# Patient Record
Sex: Male | Born: 1960 | Race: White | Hispanic: No | Marital: Married | Smoking: Former smoker
Health system: Southern US, Community
[De-identification: ages and names within clinical notes are randomized; demographics above are authoritative.]

## PROBLEM LIST (undated history)

## (undated) DIAGNOSIS — K219 Gastro-esophageal reflux disease without esophagitis: Secondary | ICD-10-CM

## (undated) DIAGNOSIS — E785 Hyperlipidemia, unspecified: Secondary | ICD-10-CM

## (undated) DIAGNOSIS — M1990 Unspecified osteoarthritis, unspecified site: Secondary | ICD-10-CM

## (undated) DIAGNOSIS — I10 Essential (primary) hypertension: Secondary | ICD-10-CM

## (undated) HISTORY — DX: Hyperlipidemia, unspecified: E78.5

## (undated) HISTORY — PX: SHOULDER SURGERY: SHX246

## (undated) HISTORY — DX: Gastro-esophageal reflux disease without esophagitis: K21.9

## (undated) HISTORY — DX: Unspecified osteoarthritis, unspecified site: M19.90

## (undated) HISTORY — DX: Essential (primary) hypertension: I10

## (undated) HISTORY — PX: COLONOSCOPY: SHX174

---

## 1993-08-12 HISTORY — PX: INGUINAL HERNIA REPAIR: SUR1180

## 2013-08-12 HISTORY — PX: ANKLE SURGERY: SHX546

## 2013-10-07 ENCOUNTER — Encounter (HOSPITAL_BASED_OUTPATIENT_CLINIC_OR_DEPARTMENT_OTHER): Admission: RE | Payer: Self-pay | Source: Ambulatory Visit

## 2013-10-07 ENCOUNTER — Ambulatory Visit (HOSPITAL_BASED_OUTPATIENT_CLINIC_OR_DEPARTMENT_OTHER): Admission: RE | Admit: 2013-10-07 | Payer: Self-pay | Source: Ambulatory Visit | Admitting: Orthopedic Surgery

## 2013-10-07 SURGERY — RELEASE, A1 PULLEY, FOR TRIGGER FINGER
Anesthesia: Monitor Anesthesia Care | Laterality: Bilateral

## 2017-10-02 DIAGNOSIS — Z0000 Encounter for general adult medical examination without abnormal findings: Secondary | ICD-10-CM | POA: Diagnosis not present

## 2017-10-02 DIAGNOSIS — I10 Essential (primary) hypertension: Secondary | ICD-10-CM | POA: Diagnosis not present

## 2017-10-02 DIAGNOSIS — R05 Cough: Secondary | ICD-10-CM | POA: Diagnosis not present

## 2017-10-02 DIAGNOSIS — N529 Male erectile dysfunction, unspecified: Secondary | ICD-10-CM | POA: Diagnosis not present

## 2018-09-23 ENCOUNTER — Encounter: Payer: Self-pay | Admitting: Family Medicine

## 2018-09-23 ENCOUNTER — Ambulatory Visit (INDEPENDENT_AMBULATORY_CARE_PROVIDER_SITE_OTHER): Payer: BLUE CROSS/BLUE SHIELD | Admitting: Family Medicine

## 2018-09-23 ENCOUNTER — Telehealth: Payer: Self-pay | Admitting: Family Medicine

## 2018-09-23 VITALS — BP 126/80 | HR 84 | Ht 71.0 in | Wt 226.0 lb

## 2018-09-23 DIAGNOSIS — N5201 Erectile dysfunction due to arterial insufficiency: Secondary | ICD-10-CM

## 2018-09-23 DIAGNOSIS — I10 Essential (primary) hypertension: Secondary | ICD-10-CM

## 2018-09-23 LAB — BASIC METABOLIC PANEL
BUN: 15 mg/dL (ref 6–23)
CHLORIDE: 100 meq/L (ref 96–112)
CO2: 27 mEq/L (ref 19–32)
Calcium: 9.4 mg/dL (ref 8.4–10.5)
Creatinine, Ser: 0.76 mg/dL (ref 0.40–1.50)
GFR: 105.22 mL/min (ref >60.00)
Glucose, Bld: 97 mg/dL (ref 70–99)
Potassium: 4.2 mEq/L (ref 3.5–5.1)
SODIUM: 138 meq/L (ref 135–145)

## 2018-09-23 MED ORDER — SILDENAFIL CITRATE 20 MG PO TABS: ORAL_TABLET | ORAL | 1 refills | Status: DC

## 2018-09-23 MED ORDER — LISINOPRIL 20 MG PO TABS: 20.0000 mg | ORAL_TABLET | Freq: Every day | ORAL | 0 refills | Status: DC

## 2018-09-23 NOTE — Telephone Encounter (Signed)
Copied from [place] #[id number]. Topic: Quick Communication - See Telephone Encounter >> Sep 23, 2018  3:35 PM Bea Graff, NT wrote: CRM for notification. See Telephone encounter for: 09/23/18. Pt states that [facility] stated that the sildenafil (REVATIO) 20 MG tablet needs to be sent to a specialty pharmacy. The pharmacy is [facility]) Phone #: [phone] per pt. Please advise.

## 2018-09-23 NOTE — Progress Notes (Addendum)
Established Patient Office Visit  Subjective:  Patient ID: Christopher Blackburn, male    DOB: Dec 15, 1960  Age: 58 y.o. MRN: [id number]  CC:  Chief Complaint  Patient presents with  . Establish Care    needs refill on sildenafil  . Annual Exam    immunizations utd, not fasting    HPI [name] presents for establishment of care and follow-up of his hypertension and ED.  Patient has been on metoprolol 100 mg twice daily for years.  This medication was used to treat his hypertension.  He does not have a history of MI or A. fib or any other heart arrhythmia.  He did have a tendency for tachycardia when it was started for him and that is no longer an issue.  He believes that his tachycardia was due to to increased alcohol abuse at that time.  He assures me that he only drinks socially and no more than 2 drinks at a given setting.  He quit smoking over 25 years ago.  He lives with his high school sweetheart.  They were married after high school and then separated.  They have been there for a year now and plan on getting married.  He has 2 children who are grown and 6 grandchildren.  His father is 49 years old and in good health.  His mother is 29 and doing well she is dealing with CLL.  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  He has a history of ED.  He does wonder if the metoprolol is partially responsible.  At this point in time it is taking 200 mg of Viagra for that to work for him.  He does have nocturia 1 at night.  Urine flow is good.  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  He is still in rehabilitation after shoulder injury and surgery this past October.  History reviewed. No pertinent past medical history.  History reviewed. No pertinent surgical history.  History reviewed. No pertinent family history.  Social History   Socioeconomic History  .  Marital status: Significant Other    Spouse name: Not on file  . Number of children: Not on file  . Years of education: Not on file  . Highest education level: Not on file  Occupational History  . Not on file  Social Needs  . Financial resource strain: Not on file  . Food insecurity:    Worry: Not on file    Inability: Not on file  . Transportation needs:    Medical: Not on file    Non-medical: Not on file  Tobacco Use  . Smoking status: Former Research scientist (life sciences)  . Smokeless tobacco: Never Used  Substance and Sexual Activity  . Alcohol use: Not on file  . Drug use: Not on file  . Sexual activity: Not on file  Lifestyle  . Physical activity:    Days per week: Not on file    Minutes per session: Not on file  . Stress: Not on file  Relationships  . Social connections:    Talks on phone: Not on file    Gets together: Not on file    Attends religious service: Not on file    Active member of club or organization: Not on file    Attends meetings of clubs or organizations: Not on file  Relationship status: Not on file  . Intimate partner violence:    Fear of current or ex partner: Not on file    Emotionally abused: Not on file    Physically abused: Not on file    Forced sexual activity: Not on file  Other Topics Concern  . Not on file  Social History Narrative  . Not on file    Outpatient Medications Prior to Visit  Medication Sig Dispense Refill  . metoprolol tartrate (LOPRESSOR) 100 MG tablet Take 100 mg by mouth 2 (two) times daily.    . sildenafil (VIAGRA) 100 MG tablet Take 100 mg by mouth daily as needed for erectile dysfunction.     No facility-administered medications prior to visit.     No Known Allergies  ROS Review of Systems  Constitutional: Negative.   HENT: Negative.   Eyes: Negative for photophobia and visual disturbance.  Respiratory: Negative.   Cardiovascular: Negative.  Negative for chest pain and palpitations.  Gastrointestinal: Negative.     Endocrine: Negative for polyphagia and polyuria.  Genitourinary: Negative for difficulty urinating, frequency and urgency.  Musculoskeletal: Negative for arthralgias.  Skin: Negative for pallor and rash.  Allergic/Immunologic: Negative for immunocompromised state.  Neurological: Negative for light-headedness and headaches.  Hematological: Does not bruise/bleed easily.  Psychiatric/Behavioral: Negative.       Objective:    Physical Exam  Constitutional: He is oriented to person, place, and time. He appears well-developed and well-nourished. No distress.  HENT:  Head: Normocephalic and atraumatic.  Right Ear: External ear normal.  Left Ear: External ear normal.  Mouth/Throat: No oropharyngeal exudate.  Eyes: Pupils are equal, round, and reactive to light. Conjunctivae are normal. Right eye exhibits no discharge. Left eye exhibits no discharge. No scleral icterus.  Neck: Neck supple. No JVD present. No tracheal deviation present. No thyromegaly present.  Cardiovascular: Normal rate, regular rhythm and normal heart sounds.  Pulmonary/Chest: Effort normal and breath sounds normal. No stridor.  Abdominal: Bowel sounds are normal.  Lymphadenopathy:    He has no cervical adenopathy.  Neurological: He is alert and oriented to person, place, and time.  Skin: Skin is warm and dry. He is not diaphoretic.  Psychiatric: He has a normal mood and affect. His behavior is normal.    BP 126/80   Pulse 84   Ht 5' 11"  (1.803 m)   Wt 226 lb (102.5 kg)   SpO2 97%   BMI 31.52 kg/m  Wt Readings from Last 3 Encounters:  09/23/18 226 lb (102.5 kg)   BP Readings from Last 3 Encounters:  09/23/18 126/80   Guideline developer:  UpToDate (see UpToDate for funding source) Date Released: June 2014  There are no preventive care reminders to display for this patient.  There are no preventive care reminders to display for this patient.  No results found for: TSH No results found for: WBC, HGB,  HCT, MCV, PLT No results found for: NA, K, CHLORIDE, CO2, GLUCOSE, BUN, CREATININE, BILITOT, ALKPHOS, AST, ALT, PROT, ALBUMIN, CALCIUM, ANIONGAP, EGFR, GFR No results found for: CHOL No results found for: HDL No results found for: LDLCALC No results found for: TRIG No results found for: CHOLHDL No results found for: HGBA1C    Assessment & Plan:   Problem List Items Addressed This Visit      Cardiovascular and Mediastinum   Essential hypertension - Primary   Relevant Medications   sildenafil (VIAGRA) 100 MG tablet   metoprolol tartrate (LOPRESSOR) 100 MG tablet   sildenafil (  REVATIO) 20 MG tablet   lisinopril (PRINIVIL,ZESTRIL) 20 MG tablet   Other Relevant Orders   Basic metabolic panel   Erectile dysfunction due to arterial insufficiency   Relevant Medications   sildenafil (VIAGRA) 100 MG tablet   metoprolol tartrate (LOPRESSOR) 100 MG tablet   sildenafil (REVATIO) 20 MG tablet   lisinopril (PRINIVIL,ZESTRIL) 20 MG tablet      Meds ordered this encounter  Medications  . sildenafil (REVATIO) 20 MG tablet    Sig: May take 3-5 daily 45 minutes prior    Dispense:  50 tablet    Refill:  1  . lisinopril (PRINIVIL,ZESTRIL) 20 MG tablet    Sig: Take 1 tablet (20 mg total) by mouth daily.    Dispense:  90 tablet    Refill:  0    Follow-up: Return in about 2 weeks (around 10/07/2018), or Will start metoprolol taper and begin lisinopril.   Patient will start the metoprolol taper 50 mg twice daily for 5 days, then 25 mg twice daily for 5 days then 12.5 mg twice daily for 5 days and stop once starting the taper he will begin his lisinopril 20 mg daily.  He will follow-up in 2 weeks for his scheduled complete physical.

## 2018-09-24 MED ORDER — SILDENAFIL CITRATE 20 MG PO TABS: ORAL_TABLET | ORAL | 1 refills | Status: DC

## 2018-09-24 NOTE — Addendum Note (Signed)
Addended by: Rodrigo Ran on: 09/24/2018 08:06 AM   Modules accepted: Orders

## 2018-09-24 NOTE — Telephone Encounter (Signed)
Rx sent to [facility]

## 2018-10-06 ENCOUNTER — Encounter: Payer: Self-pay | Admitting: Family Medicine

## 2018-10-06 ENCOUNTER — Ambulatory Visit (INDEPENDENT_AMBULATORY_CARE_PROVIDER_SITE_OTHER): Payer: BLUE CROSS/BLUE SHIELD | Admitting: Family Medicine

## 2018-10-06 VITALS — BP 128/80 | HR 82 | Temp 98.2°F | Ht 71.0 in | Wt 226.0 lb

## 2018-10-06 DIAGNOSIS — Z0000 Encounter for general adult medical examination without abnormal findings: Secondary | ICD-10-CM | POA: Diagnosis not present

## 2018-10-06 NOTE — Patient Instructions (Signed)
Health Maintenance, Male A healthy lifestyle and preventive care is important for your health and wellness. Ask your health care provider about what schedule of regular examinations is right for you. What should I know about weight and diet? Eat a Healthy Diet  Eat plenty of vegetables, fruits, whole grains, low-fat dairy products, and lean protein.  Do not eat a lot of foods high in solid fats, added sugars, or salt.  Maintain a Healthy Weight Regular exercise can help you achieve or maintain a healthy weight. You should:  Do at least 150 minutes of exercise each week. The exercise should increase your heart rate and make you sweat (moderate-intensity exercise).  Do strength-training exercises at least twice a week. Watch Your Levels of Cholesterol and Blood Lipids  Have your blood tested for lipids and cholesterol every 5 years starting at 59 years of age. If you are at high risk for heart disease, you should start having your blood tested when you are 59 years old. You may need to have your cholesterol levels checked more often if: ? Your lipid or cholesterol levels are high. ? You are older than 59 years of age. ? You are at high risk for heart disease. What should I know about cancer screening? Many types of cancers can be detected early and may often be prevented. Lung Cancer  You should be screened every year for lung cancer if: ? You are a current smoker who has smoked for at least 30 years. ? You are a former smoker who has quit within the past 15 years.  Talk to your health care provider about your screening options, when you should start screening, and how often you should be screened. Colorectal Cancer  Routine colorectal cancer screening usually begins at 59 years of age and should be repeated every 5-10 years until you are 59 years old. You may need to be screened more often if early forms of precancerous polyps or small growths are found. Your health care provider may  recommend screening at an earlier age if you have risk factors for colon cancer.  Your health care provider may recommend using home test kits to check for hidden blood in the stool.  A small camera at the end of a tube can be used to examine your colon (sigmoidoscopy or colonoscopy). This checks for the earliest forms of colorectal cancer. Prostate and Testicular Cancer  Depending on your age and overall health, your health care provider may do certain tests to screen for prostate and testicular cancer.  Talk to your health care provider about any symptoms or concerns you have about testicular or prostate cancer. Skin Cancer  Check your skin from head to toe regularly.  Tell your health care provider about any new moles or changes in moles, especially if: ? There is a change in a mole's size, shape, or color. ? You have a mole that is larger than a pencil eraser.  Always use sunscreen. Apply sunscreen liberally and repeat throughout the day.  Protect yourself by wearing long sleeves, pants, a wide-brimmed hat, and sunglasses when outside. What should I know about heart disease, diabetes, and high blood pressure?  If you are 18-39 years of age, have your blood pressure checked every 3-5 years. If you are 40 years of age or older, have your blood pressure checked every year. You should have your blood pressure measured twice-once when you are at a hospital or clinic, and once when you are not at a hospital   or clinic. Record the average of the two measurements. To check your blood pressure when you are not at a hospital or clinic, you can use: ? An automated blood pressure machine at a pharmacy. ? A home blood pressure monitor.  Talk to your health care provider about your target blood pressure.  If you are between 34-41 years old, ask your health care provider if you should take aspirin to prevent heart disease.  Have regular diabetes screenings by checking your fasting blood sugar  level. ? If you are at a normal weight and have a low risk for diabetes, have this test once every three years after the age of 39. ? If you are overweight and have a high risk for diabetes, consider being tested at a younger age or more often.  A one-time screening for abdominal aortic aneurysm (AAA) by ultrasound is recommended for men aged 66-75 years who are current or former smokers. What should I know about preventing infection? Hepatitis B If you have a higher risk for hepatitis B, you should be screened for this virus. Talk with your health care provider to find out if you are at risk for hepatitis B infection. Hepatitis C Blood testing is recommended for:  Everyone born from [birth year] through 1966.  Anyone with known risk factors for hepatitis C. Sexually Transmitted Diseases (STDs)  You should be screened each year for STDs including gonorrhea and chlamydia if: ? You are sexually active and are younger than 59 years of age. ? You are older than 59 years of age and your health care provider tells you that you are at risk for this type of infection. ? Your sexual activity has changed since you were last screened and you are at an increased risk for chlamydia or gonorrhea. Ask your health care provider if you are at risk.  Talk with your health care provider about whether you are at high risk of being infected with HIV. Your health care provider may recommend a prescription medicine to help prevent HIV infection. What else can I do?  Schedule regular health, dental, and eye exams.  Stay current with your vaccines (immunizations).  Do not use any tobacco products, such as cigarettes, chewing tobacco, and e-cigarettes. If you need help quitting, ask your health care provider.  Limit alcohol intake to no more than 2 drinks per day. One drink equals 12 ounces of beer, 5 ounces of wine, or 1 ounces of hard liquor.  Do not use street drugs.  Do not share needles.  Ask your health  care provider for help if you need support or information about quitting drugs.  Tell your health care provider if you often feel depressed.  Tell your health care provider if you have ever been abused or do not feel safe at home. This information is not intended to replace advice given to you by your health care provider. Make sure you discuss any questions you have with your health care provider. Document Released: 01/25/2008 Document Revised: 03/27/2016 Document Reviewed: 05/02/2015 Elsevier Interactive Patient Education  2019 [place] refers to food and lifestyle choices that are based on the traditions of countries located on the [employer]. This way of eating has been shown to help prevent certain conditions and improve outcomes for people who have chronic diseases, like kidney disease and heart disease. What are tips for following this plan? Lifestyle  Cook and eat meals together with your family, when possible.  Drink  enough fluid to keep your urine clear or pale yellow.  Be physically active every day. This includes: ? Aerobic exercise like running or swimming. ? Leisure activities like gardening, walking, or housework.  Get 7-8 hours of sleep each night.  If recommended by your health care provider, drink red wine in moderation. This means 1 glass a day for nonpregnant women and 2 glasses a day for men. A glass of wine equals 5 oz (150 mL). Reading food labels   Check the serving size of packaged foods. For foods such as rice and pasta, the serving size refers to the amount of cooked product, not dry.  Check the total fat in packaged foods. Avoid foods that have saturated fat or trans fats.  Check the ingredients list for added sugars, such as corn syrup. Shopping  At the grocery store, buy most of your food from the areas near the walls of the store. This includes: ? Fresh fruits and vegetables (produce). ? Grains,  beans, nuts, and seeds. Some of these may be available in unpackaged forms or large amounts (in bulk). ? Fresh seafood. ? Poultry and eggs. ? Low-fat dairy products.  Buy whole ingredients instead of prepackaged foods.  Buy fresh fruits and vegetables in-season from local farmers markets.  Buy frozen fruits and vegetables in resealable bags.  If you do not have access to quality fresh seafood, buy precooked frozen shrimp or canned fish, such as tuna, salmon, or sardines.  Buy small amounts of raw or cooked vegetables, salads, or olives from the deli or salad bar at your store.  Stock your pantry so you always have certain foods on hand, such as olive oil, canned tuna, canned tomatoes, rice, pasta, and beans. Cooking  Cook foods with extra-virgin olive oil instead of using butter or other vegetable oils.  Have meat as a side dish, and have vegetables or grains as your main dish. This means having meat in small portions or adding small amounts of meat to foods like pasta or stew.  Use beans or vegetables instead of meat in common dishes like chili or lasagna.  Experiment with different cooking methods. Try roasting or broiling vegetables instead of steaming or sauteing them.  Add frozen vegetables to soups, stews, pasta, or rice.  Add nuts or seeds for added healthy fat at each meal. You can add these to yogurt, salads, or vegetable dishes.  Marinate fish or vegetables using olive oil, lemon juice, garlic, and fresh herbs. Meal planning   Plan to eat 1 vegetarian meal one day each week. Try to work up to 2 vegetarian meals, if possible.  Eat seafood 2 or more times a week.  Have healthy snacks readily available, such as: ? Vegetable sticks with hummus. ? [place] yogurt. ? Fruit and nut trail mix.  Eat balanced meals throughout the week. This includes: ? Fruit: 2-3 servings a day ? Vegetables: 4-5 servings a day ? Low-fat dairy: 2 servings a day ? Fish, poultry, or lean  meat: 1 serving a day ? Beans and legumes: 2 or more servings a week ? Nuts and seeds: 1-2 servings a day ? Whole grains: 6-8 servings a day ? Extra-virgin olive oil: 3-4 servings a day  Limit red meat and sweets to only a few servings a month What are my food choices?  Mediterranean diet ? Recommended ? Grains: Whole-grain pasta. Brown rice. Bulgar wheat. Polenta. Couscous. Whole-wheat bread. [place]. ? Vegetables: Artichokes. Beets. Broccoli. Cabbage. Carrots. Eggplant. Green beans. Chard.  Kale. Spinach. Onions. Leeks. Peas. Squash. Tomatoes. Peppers. Radishes. ? Fruits: Apples. Apricots. Avocado. Berries. Bananas. Cherries. Dates. Figs. Grapes. Lemons. Melon. Oranges. Peaches. Plums. Pomegranate. ? Meats and other protein foods: Beans. Almonds. Sunflower seeds. Pine nuts. Peanuts. [place]. Salmon. Scallops. Shrimp. [place]. Tilapia. Clams. Oysters. Eggs. ? Dairy: Low-fat milk. Cheese. Greek yogurt. ? Beverages: Water. Red wine. Herbal tea. ? Fats and oils: Extra virgin olive oil. Avocado oil. Grape seed oil. ? Sweets and desserts: [place] yogurt with honey. Baked apples. Poached pears. Trail mix. ? Seasoning and other foods: Basil. Cilantro. Coriander. Cumin. Mint. Parsley. Sage. Rosemary. Tarragon. Garlic. Oregano. Thyme. Pepper. Balsalmic vinegar. Tahini. Hummus. Tomato sauce. Olives. Mushrooms. ? Limit these ? Grains: Prepackaged pasta or rice dishes. Prepackaged cereal with added sugar. ? Vegetables: Deep fried potatoes (french fries). ? Fruits: Fruit canned in syrup. ? Meats and other protein foods: Beef. Pork. Lamb. Poultry with skin. Hot dogs. [place] Salines. ? Dairy: Ice cream. Sour cream. Whole milk. ? Beverages: Juice. Sugar-sweetened soft drinks. Beer. Liquor and spirits. ? Fats and oils: Butter. Canola oil. Vegetable oil. Beef fat (tallow). Lard. ? Sweets and desserts: Cookies. Cakes. Pies. Candy. ? Seasoning and other foods: Mayonnaise. Premade sauces and marinades. ? The items  listed may not be a complete list. Talk with your dietitian about what dietary choices are right for you. Summary  The Mediterranean diet includes both food and lifestyle choices.  Eat a variety of fresh fruits and vegetables, beans, nuts, seeds, and whole grains.  Limit the amount of red meat and sweets that you eat.  Talk with your health care provider about whether it is safe for you to drink red wine in moderation. This means 1 glass a day for nonpregnant women and 2 glasses a day for men. A glass of wine equals 5 oz (150 mL). This information is not intended to replace advice given to you by your health care provider. Make sure you discuss any questions you have with your health care provider. Document Released: 03/21/2016 Document Revised: 04/23/2016 Document Reviewed: 03/21/2016 Elsevier Interactive Patient Education  2019 [employer].

## 2018-10-06 NOTE — Progress Notes (Signed)
Established Patient Office Visit  Subjective:  Patient ID: Christopher Blackburn, male    DOB: 26-Sep-1960  Age: 58 y.o. MRN: [id number]  CC:  Chief Complaint  Patient presents with  . Annual Exam    HPI Nohlan Burdin presents for for complete physical exam and follow-up of his blood pressure status post metoprolol wean and initiation of lisinopril therapy.  Patient is tolerating the lisinopril well and is not having any type of cough.  Blood pressure seen today is on lisinopril treatment only.  Taper of metoprolol was uneventful for him.  He is nonfasting today.  I did have a colonoscopy in the distant past that showed some polyps.  He is interested in pursuing the Cologuard at this time.  He is currently using a weight watchers like diet plan with his significant other.  He is hoping to lose some weight.  Past medical history of right hernia repair.  He sometimes feels as though it is return but it has not bothered him.  Past Medical History:  Diagnosis Date  . GERD (gastroesophageal reflux disease)   . Hypertension     Past Surgical History:  Procedure Laterality Date  . SHOULDER SURGERY      History reviewed. No pertinent family history.  Social History   Socioeconomic History  . Marital status: Significant Other    Spouse name: Not on file  . Number of children: Not on file  . Years of education: Not on file  . Highest education level: Not on file  Occupational History  . Not on file  Social Needs  . Financial resource strain: Not on file  . Food insecurity:    Worry: Not on file    Inability: Not on file  . Transportation needs:    Medical: Not on file    Non-medical: Not on file  Tobacco Use  . Smoking status: Former Smoker    Last attempt to quit: 09/23/1992    Years since quitting: 26.0  . Smokeless tobacco: Never Used  Substance and Sexual Activity  . Alcohol use: Yes    Comment: socially and no more than 2 in a given setting.   . Drug use: Never  .  Sexual activity: Not on file  Lifestyle  . Physical activity:    Days per week: Not on file    Minutes per session: Not on file  . Stress: Not on file  Relationships  . Social connections:    Talks on phone: Not on file    Gets together: Not on file    Attends religious service: Not on file    Active member of club or organization: Not on file    Attends meetings of clubs or organizations: Not on file    Relationship status: Not on file  . Intimate partner violence:    Fear of current or ex partner: Not on file    Emotionally abused: Not on file    Physically abused: Not on file    Forced sexual activity: Not on file  Other Topics Concern  . Not on file  Social History Narrative  . Not on file    Outpatient Medications Prior to Visit  Medication Sig Dispense Refill  . lisinopril (PRINIVIL,ZESTRIL) 20 MG tablet Take 1 tablet (20 mg total) by mouth daily. 90 tablet 0  . sildenafil (REVATIO) 20 MG tablet May take 3-5 daily 45 minutes prior 50 tablet 1  . metoprolol tartrate (LOPRESSOR) 100 MG tablet Take 100 mg by mouth  2 (two) times daily.    . sildenafil (VIAGRA) 100 MG tablet Take 100 mg by mouth daily as needed for erectile dysfunction.     No facility-administered medications prior to visit.     No Known Allergies  ROS Review of Systems  Constitutional: Negative for diaphoresis, fatigue, fever and unexpected weight change.  HENT: Negative.   Eyes: Negative for photophobia and visual disturbance.  Respiratory: Negative for cough, shortness of breath and wheezing.   Cardiovascular: Negative for palpitations.  Gastrointestinal: Negative for abdominal pain, blood in stool, constipation and diarrhea.  Endocrine: Negative for polyphagia and polyuria.  Genitourinary: Negative for difficulty urinating, frequency, hematuria and urgency.  Musculoskeletal: Negative for gait problem and joint swelling.  Skin: Negative for pallor and rash.  Allergic/Immunologic: Negative for  immunocompromised state.  Neurological: Negative for light-headedness and headaches.  Hematological: Does not bruise/bleed easily.  Psychiatric/Behavioral: Negative.       Objective:    Physical Exam  Constitutional: He is oriented to person, place, and time. He appears well-developed and well-nourished. No distress.  HENT:  Head: Normocephalic and atraumatic.  Right Ear: External ear normal.  Left Ear: External ear normal.  Mouth/Throat: Oropharynx is clear and moist. No oropharyngeal exudate.  Eyes: Pupils are equal, round, and reactive to light. Conjunctivae are normal. Right eye exhibits no discharge. Left eye exhibits no discharge. No scleral icterus.  Neck: Neck supple. No JVD present. No tracheal deviation present. No thyromegaly present.  Cardiovascular: Normal rate, regular rhythm and normal heart sounds.  Pulmonary/Chest: Effort normal and breath sounds normal. No stridor.  Abdominal: Soft. Bowel sounds are normal. He exhibits no distension and no mass. There is no abdominal tenderness. There is no rebound and no guarding. A hernia is present. Hernia confirmed positive in the right inguinal area (small right). Hernia confirmed negative in the left inguinal area.  Genitourinary:    Prostate, penis and rectum normal.  Rectum:     Guaiac result negative.     No rectal mass, anal fissure, tenderness, external hemorrhoid, internal hemorrhoid or abnormal anal tone.  Prostate is not enlarged and not tender. Right testis shows no mass and no swelling. Right testis is descended. Left testis shows no mass, no swelling and no tenderness. Left testis is descended. Uncircumcised. No phimosis, paraphimosis, hypospadias, penile erythema or penile tenderness. No discharge found.  Musculoskeletal:        General: No tenderness, deformity or edema.  Lymphadenopathy:    He has no cervical adenopathy.       Right: No inguinal adenopathy present.       Left: No inguinal adenopathy present.    Neurological: He is alert and oriented to person, place, and time. No cranial nerve deficit. Coordination normal.  Skin: Skin is warm and dry. No rash noted. No erythema.     Psychiatric: He has a normal mood and affect. His behavior is normal.    BP 128/80   Pulse 82   Temp 98.2 F (36.8 C) (Oral)   Ht 5\' 11"  (1.803 m)   Wt 226 lb (102.5 kg)   SpO2 96%   BMI 31.52 kg/m  Wt Readings from Last 3 Encounters:  10/06/18 226 lb (102.5 kg)  09/23/18 226 lb (102.5 kg)   BP Readings from Last 3 Encounters:  10/06/18 128/80  09/23/18 126/80   Guideline developer:  UpToDate (see UpToDate for funding source) Date Released: June 2014  There are no preventive care reminders to display for this patient.  There  are no preventive care reminders to display for this patient.  No results found for: TSH No results found for: WBC, HGB, HCT, MCV, PLT Lab Results  Component Value Date   NA 138 09/23/2018   K 4.2 09/23/2018   CO2 27 09/23/2018   GLUCOSE 97 09/23/2018   BUN 15 09/23/2018   CREATININE 0.76 09/23/2018   CALCIUM 9.4 09/23/2018   GFR 105.22 09/23/2018   No results found for: CHOL No results found for: HDL No results found for: LDLCALC No results found for: TRIG No results found for: CHOLHDL No results found for: HGBA1C    Assessment & Plan:   Problem List Items Addressed This Visit      Other   Healthcare maintenance - Primary   Relevant Orders   CBC   Comprehensive metabolic panel   Lipid panel   PSA   Urinalysis, Routine w reflex microscopic      No orders of the defined types were placed in this encounter.   Follow-up: Return in about 3 months (around 01/04/2019).   Patient will return fasting for above ordered blood work.  He will continue lisinopril and follow-up in 3 months.  He was given information on health maintenance and disease prevention.  He was also given information on the Mediterranean diet.  Watchful waiting for his small right inguinal  hernia.  Advised to avoid heavy lifting pushing and pulling.  Follow-up with any change.

## 2018-10-19 ENCOUNTER — Other Ambulatory Visit (INDEPENDENT_AMBULATORY_CARE_PROVIDER_SITE_OTHER): Payer: BLUE CROSS/BLUE SHIELD

## 2018-10-19 DIAGNOSIS — Z0000 Encounter for general adult medical examination without abnormal findings: Secondary | ICD-10-CM

## 2018-10-19 LAB — COMPREHENSIVE METABOLIC PANEL
ALT: 15 U/L (ref 0–53)
AST: 17 U/L (ref 0–37)
Albumin: 4.1 g/dL (ref 3.5–5.2)
Alkaline Phosphatase: 68 U/L (ref 39–117)
BUN: 11 mg/dL (ref 6–23)
CO2: 27 mEq/L (ref 19–32)
Calcium: 9.1 mg/dL (ref 8.4–10.5)
Chloride: 103 mEq/L (ref 96–112)
Creatinine, Ser: 0.89 mg/dL (ref 0.40–1.50)
GFR: 87.67 mL/min (ref >60.00)
GLUCOSE: 99 mg/dL (ref 70–99)
Potassium: 4.4 mEq/L (ref 3.5–5.1)
Sodium: 137 mEq/L (ref 135–145)
Total Bilirubin: 0.8 mg/dL (ref 0.2–1.2)
Total Protein: 6.6 g/dL (ref 6.0–8.3)

## 2018-10-19 LAB — URINALYSIS, ROUTINE W REFLEX MICROSCOPIC
Bilirubin Urine: NEGATIVE
KETONES UR: NEGATIVE
Leukocytes,Ua: NEGATIVE
NITRITE: NEGATIVE
Specific Gravity, Urine: 1.02 (ref 1.000–1.030)
Total Protein, Urine: NEGATIVE
Urine Glucose: NEGATIVE
Urobilinogen, UA: 0.2 (ref 0.0–1.0)
pH: 6.5 (ref 5.0–8.0)

## 2018-10-19 LAB — CBC
HEMATOCRIT: 46.4 % (ref 39.0–52.0)
Hemoglobin: 16.1 g/dL (ref 13.0–17.0)
MCHC: 34.8 g/dL (ref 30.0–36.0)
MCV: 88.2 fl (ref 78.0–100.0)
Platelets: 294 10*3/uL (ref 150.0–400.0)
RBC: 5.25 Mil/uL (ref 4.22–5.81)
RDW: 14.1 % (ref 11.5–15.5)
WBC: 7.2 10*3/uL (ref 4.0–10.5)

## 2018-10-19 LAB — PSA: PSA: 0.44 ng/mL (ref 0.10–4.00)

## 2018-10-19 LAB — LIPID PANEL
Cholesterol: 186 mg/dL (ref 0–200)
HDL: 46.9 mg/dL (ref >39.00)
LDL Cholesterol: 111 mg/dL — ABNORMAL HIGH (ref 0–99)
NONHDL: 139.09
Total CHOL/HDL Ratio: 4
Triglycerides: 140 mg/dL (ref 0.0–149.0)
VLDL: 28 mg/dL (ref 0.0–40.0)

## 2018-11-25 ENCOUNTER — Other Ambulatory Visit: Payer: Self-pay | Admitting: Family Medicine

## 2018-11-25 DIAGNOSIS — N5201 Erectile dysfunction due to arterial insufficiency: Secondary | ICD-10-CM

## 2018-11-25 MED ORDER — SILDENAFIL CITRATE 20 MG PO TABS: ORAL_TABLET | ORAL | 1 refills | Status: DC

## 2018-11-25 NOTE — Telephone Encounter (Signed)
Requested Prescriptions  Pending Prescriptions Disp Refills  . sildenafil (REVATIO) 20 MG tablet 50 tablet 1    Sig: May take 3-5 daily 45 minutes prior     Urology: Erectile Dysfunction Agents Passed - 11/25/2018  1:07 PM      Passed - Last BP in normal range    BP Readings from Last 1 Encounters:  10/06/18 128/80         Passed - Valid encounter within last 12 months    Recent Outpatient Visits          1 month ago Healthcare maintenance   [facility], Mortimer Fries, MD   2 months ago Essential hypertension   [facility] Ethelene Hal, Mortimer Fries, MD

## 2018-12-15 ENCOUNTER — Other Ambulatory Visit: Payer: Self-pay | Admitting: Family Medicine

## 2018-12-15 DIAGNOSIS — I10 Essential (primary) hypertension: Secondary | ICD-10-CM

## 2019-03-16 ENCOUNTER — Other Ambulatory Visit: Payer: Self-pay | Admitting: Family Medicine

## 2019-03-16 DIAGNOSIS — I10 Essential (primary) hypertension: Secondary | ICD-10-CM

## 2019-03-19 DIAGNOSIS — H43811 Vitreous degeneration, right eye: Secondary | ICD-10-CM | POA: Diagnosis not present

## 2019-03-24 ENCOUNTER — Other Ambulatory Visit: Payer: Self-pay

## 2019-03-24 DIAGNOSIS — I10 Essential (primary) hypertension: Secondary | ICD-10-CM

## 2019-03-24 MED ORDER — LISINOPRIL 20 MG PO TABS: 20.0000 mg | ORAL_TABLET | Freq: Every day | ORAL | 2 refills | Status: DC

## 2019-04-07 ENCOUNTER — Telehealth: Payer: Self-pay | Admitting: Family Medicine

## 2019-04-07 NOTE — Telephone Encounter (Signed)
Called pt on behalf of Dr Ethelene Hal, based on last appt pt was to schedule a 3 month follow up which would have been in May, now it would be a 6 month follow up. Left message to see if pt would like to schedule.

## 2019-04-08 ENCOUNTER — Other Ambulatory Visit: Payer: Self-pay

## 2019-04-08 DIAGNOSIS — N5201 Erectile dysfunction due to arterial insufficiency: Secondary | ICD-10-CM

## 2019-04-08 MED ORDER — SILDENAFIL CITRATE 20 MG PO TABS: ORAL_TABLET | ORAL | 1 refills | Status: DC

## 2019-04-20 DIAGNOSIS — H43811 Vitreous degeneration, right eye: Secondary | ICD-10-CM | POA: Diagnosis not present

## 2019-04-22 ENCOUNTER — Encounter: Payer: Self-pay | Admitting: Family Medicine

## 2019-04-22 ENCOUNTER — Ambulatory Visit (INDEPENDENT_AMBULATORY_CARE_PROVIDER_SITE_OTHER): Payer: BC Managed Care – PPO | Admitting: Family Medicine

## 2019-04-22 ENCOUNTER — Other Ambulatory Visit: Payer: Self-pay

## 2019-04-22 VITALS — BP 128/80 | HR 80 | Ht 71.0 in | Wt 223.2 lb

## 2019-04-22 DIAGNOSIS — N5201 Erectile dysfunction due to arterial insufficiency: Secondary | ICD-10-CM | POA: Diagnosis not present

## 2019-04-22 DIAGNOSIS — I10 Essential (primary) hypertension: Secondary | ICD-10-CM | POA: Diagnosis not present

## 2019-04-22 DIAGNOSIS — G4700 Insomnia, unspecified: Secondary | ICD-10-CM

## 2019-04-22 DIAGNOSIS — Z23 Encounter for immunization: Secondary | ICD-10-CM

## 2019-04-22 LAB — URINALYSIS, ROUTINE W REFLEX MICROSCOPIC
Bilirubin Urine: NEGATIVE
Ketones, ur: NEGATIVE
Leukocytes,Ua: NEGATIVE
Nitrite: NEGATIVE
Specific Gravity, Urine: 1.01 (ref 1.000–1.030)
Total Protein, Urine: NEGATIVE
Urine Glucose: NEGATIVE
Urobilinogen, UA: 0.2 (ref 0.0–1.0)
WBC, UA: NONE SEEN (ref 0–?)
pH: 6 (ref 5.0–8.0)

## 2019-04-22 LAB — BASIC METABOLIC PANEL
BUN: 19 mg/dL (ref 6–23)
CO2: 27 mEq/L (ref 19–32)
Calcium: 8.8 mg/dL (ref 8.4–10.5)
Chloride: 103 mEq/L (ref 96–112)
Creatinine, Ser: 1.08 mg/dL (ref 0.40–1.50)
GFR: 70 mL/min (ref >60.00)
Glucose, Bld: 91 mg/dL (ref 70–99)
Potassium: 4.5 mEq/L (ref 3.5–5.1)
Sodium: 135 mEq/L (ref 135–145)

## 2019-04-22 MED ORDER — SILDENAFIL CITRATE 20 MG PO TABS: ORAL_TABLET | ORAL | 2 refills | Status: DC

## 2019-04-22 MED ORDER — LISINOPRIL 20 MG PO TABS: 20.0000 mg | ORAL_TABLET | Freq: Every day | ORAL | 2 refills | Status: DC

## 2019-04-22 NOTE — Patient Instructions (Signed)

## 2019-04-22 NOTE — Progress Notes (Addendum)
Established Patient Office Visit  Subjective:  Patient ID: Christopher Blackburn, male    DOB: 10/13/1960  Age: 58 y.o. MRN: [id number]  CC:  Chief Complaint  Patient presents with  . Medication Refill    HPI Christopher Blackburn presents for follow-up of his hypertension and ED.  Blood pressure is well controlled with lisinopril and is tolerating the drug beautifully.  Revatio works well for him and he has required dosages much lower than the directions on his prescriptions suggest that he take.  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  He stays active at home and has been walking in his neighborhood.  Past Medical History:  Diagnosis Date  . GERD (gastroesophageal reflux disease)   . Hypertension     Past Surgical History:  Procedure Laterality Date  . SHOULDER SURGERY      History reviewed. No pertinent family history.  Social History   Socioeconomic History  . Marital status: Significant Other    Spouse name: Not on file  . Number of children: Not on file  . Years of education: Not on file  . Highest education level: Not on file  Occupational History  . Not on file  Social Needs  . Financial resource strain: Not on file  . Food insecurity    Worry: Not on file    Inability: Not on file  . Transportation needs    Medical: Not on file    Non-medical: Not on file  Tobacco Use  . Smoking status: Former Smoker    Quit date: 09/23/1992    Years since quitting: 26.5  . Smokeless tobacco: Never Used  Substance and Sexual Activity  . Alcohol use: Yes    Comment: socially and no more than 2 in a given setting.   . Drug use: Never  . Sexual activity: Not on file  Lifestyle  . Physical activity    Days per week: Not on file    Minutes per session: Not on file  . Stress: Not on file  Relationships  . Social Herbalist on phone: Not on file    Gets together: Not on file    Attends religious service: Not on file   Active member of club or organization: Not on file    Attends meetings of clubs or organizations: Not on file    Relationship status: Not on file  . Intimate partner violence    Fear of current or ex partner: Not on file    Emotionally abused: Not on file    Physically abused: Not on file    Forced sexual activity: Not on file  Other Topics Concern  . Not on file  Social History Narrative  . Not on file    Outpatient Medications Prior to Visit  Medication Sig Dispense Refill  . lisinopril (ZESTRIL) 20 MG tablet Take 1 tablet (20 mg total) by mouth daily. 30 tablet 2  . sildenafil (REVATIO) 20 MG tablet May take 3-5 daily 45 minutes prior 50 tablet 1   No facility-administered medications prior to visit.     No Known Allergies  ROS Review of Systems  Constitutional: Negative.   HENT: Negative.   Eyes: Negative for photophobia and visual disturbance.  Respiratory: Negative.   Cardiovascular: Negative.   Gastrointestinal: Negative.   Endocrine: Negative for polyphagia and polyuria.  Genitourinary: Negative.   Musculoskeletal: Negative for gait problem and joint swelling.  Skin: Negative for pallor and rash.  Allergic/Immunologic: Negative for immunocompromised state.  Neurological: Negative for light-headedness and numbness.  Hematological: Does not bruise/bleed easily.  Psychiatric/Behavioral: Negative.       Objective:    Physical Exam  Constitutional: He is oriented to person, place, and time. He appears well-developed and well-nourished. No distress.  HENT:  Head: Normocephalic and atraumatic.  Right Ear: External ear normal.  Left Ear: External ear normal.  Mouth/Throat: Oropharynx is clear and moist. No oropharyngeal exudate.  Eyes: Conjunctivae are normal. Right eye exhibits no discharge. Left eye exhibits no discharge. No scleral icterus.  Neck: Neck supple. No JVD present. No tracheal deviation present. No thyromegaly present.  Cardiovascular: Normal rate,  regular rhythm and normal heart sounds.  Pulmonary/Chest: Effort normal and breath sounds normal. No stridor.  Abdominal: Bowel sounds are normal.  Musculoskeletal:        General: No edema.  Lymphadenopathy:    He has no cervical adenopathy.  Neurological: He is alert and oriented to person, place, and time.  Skin: Skin is warm and dry. He is not diaphoretic.  Psychiatric: He has a normal mood and affect. His behavior is normal.    BP 128/80   Pulse 80   Ht 5\' 11"  (1.803 m)   Wt 223 lb 4 oz (101.3 kg)   SpO2 99%   BMI 31.14 kg/m  Wt Readings from Last 3 Encounters:  04/22/19 223 lb 4 oz (101.3 kg)  10/06/18 226 lb (102.5 kg)  09/23/18 226 lb (102.5 kg)   BP Readings from Last 3 Encounters:  04/22/19 128/80  10/06/18 128/80  09/23/18 126/80   Guideline developer:  UpToDate (see UpToDate for funding source) Date Released: June 2014  There are no preventive care reminders to display for this patient.  There are no preventive care reminders to display for this patient.  No results found for: TSH Lab Results  Component Value Date   WBC 7.2 10/19/2018   HGB 16.1 10/19/2018   HCT 46.4 10/19/2018   MCV 88.2 10/19/2018   PLT 294.0 10/19/2018   Lab Results  Component Value Date   NA 135 04/22/2019   K 4.5 04/22/2019   CO2 27 04/22/2019   GLUCOSE 91 04/22/2019   BUN 19 04/22/2019   CREATININE 1.08 04/22/2019   BILITOT 0.8 10/19/2018   ALKPHOS 68 10/19/2018   AST 17 10/19/2018   ALT 15 10/19/2018   PROT 6.6 10/19/2018   ALBUMIN 4.1 10/19/2018   CALCIUM 8.8 04/22/2019   GFR 70.00 04/22/2019   Lab Results  Component Value Date   CHOL 186 10/19/2018   Lab Results  Component Value Date   HDL 46.90 10/19/2018   Lab Results  Component Value Date   LDLCALC 111 (H) 10/19/2018   Lab Results  Component Value Date   TRIG 140.0 10/19/2018   Lab Results  Component Value Date   CHOLHDL 4 10/19/2018   No results found for: HGBA1C    Assessment & Plan:    Problem List Items Addressed This Visit      Cardiovascular and Mediastinum   Essential hypertension   Relevant Medications   sildenafil (REVATIO) 20 MG tablet   lisinopril (ZESTRIL) 20 MG tablet   Other Relevant Orders   Basic metabolic panel (Completed)   Urinalysis, Routine w reflex microscopic (Completed)   Erectile dysfunction due to arterial insufficiency   Relevant Medications   sildenafil (REVATIO) 20 MG tablet   lisinopril (ZESTRIL) 20 MG tablet     Other   Need for  influenza vaccination - Primary   Relevant Orders   Flu Vaccine QUAD 36+ mos IM (Completed)    Other Visit Diagnoses    Insomnia, unspecified type       Relevant Medications   traZODone (DESYREL) 50 MG tablet      Meds ordered this encounter  Medications  . sildenafil (REVATIO) 20 MG tablet    Sig: May take 3-5 daily 45 minutes prior    Dispense:  50 tablet    Refill:  2  . lisinopril (ZESTRIL) 20 MG tablet    Sig: Take 1 tablet (20 mg total) by mouth daily.    Dispense:  90 tablet    Refill:  2  . traZODone (DESYREL) 50 MG tablet    Sig: Take 0.5-1 tablets (25-50 mg total) by mouth at bedtime as needed for sleep.    Dispense:  30 tablet    Refill:  3    Follow-up: Return in about 6 months (around 10/20/2019).   Patient was given information on exercising to lose weight.  Follow-up will be in 6 months or sooner as needed.

## 2019-04-23 ENCOUNTER — Encounter: Payer: Self-pay | Admitting: Family Medicine

## 2019-04-23 MED ORDER — TRAZODONE HCL 50 MG PO TABS: 25.0000 mg | ORAL_TABLET | Freq: Every evening | ORAL | 3 refills | Status: DC | PRN

## 2019-04-23 NOTE — Addendum Note (Signed)
Addended by: Abelino Derrick A on: 04/23/2019 12:08 PM   Modules accepted: Orders

## 2019-05-12 ENCOUNTER — Encounter: Payer: Self-pay | Admitting: Family Medicine

## 2019-05-12 ENCOUNTER — Other Ambulatory Visit: Payer: Self-pay

## 2019-05-12 DIAGNOSIS — N5201 Erectile dysfunction due to arterial insufficiency: Secondary | ICD-10-CM

## 2019-05-12 MED ORDER — SILDENAFIL CITRATE 20 MG PO TABS: ORAL_TABLET | ORAL | 2 refills | Status: DC

## 2019-08-21 ENCOUNTER — Other Ambulatory Visit: Payer: Self-pay | Admitting: Family Medicine

## 2019-08-21 DIAGNOSIS — G4700 Insomnia, unspecified: Secondary | ICD-10-CM

## 2019-10-18 ENCOUNTER — Encounter: Payer: Self-pay | Admitting: Family Medicine

## 2019-10-18 ENCOUNTER — Other Ambulatory Visit: Payer: Self-pay

## 2019-10-18 ENCOUNTER — Ambulatory Visit (INDEPENDENT_AMBULATORY_CARE_PROVIDER_SITE_OTHER): Payer: BC Managed Care – PPO | Admitting: Family Medicine

## 2019-10-18 VITALS — BP 124/72 | HR 67 | Temp 97.8°F | Ht 69.0 in | Wt 237.4 lb

## 2019-10-18 DIAGNOSIS — Z0000 Encounter for general adult medical examination without abnormal findings: Secondary | ICD-10-CM | POA: Diagnosis not present

## 2019-10-18 DIAGNOSIS — Z125 Encounter for screening for malignant neoplasm of prostate: Secondary | ICD-10-CM | POA: Diagnosis not present

## 2019-10-18 DIAGNOSIS — I10 Essential (primary) hypertension: Secondary | ICD-10-CM

## 2019-10-18 DIAGNOSIS — G4700 Insomnia, unspecified: Secondary | ICD-10-CM

## 2019-10-18 DIAGNOSIS — Z87448 Personal history of other diseases of urinary system: Secondary | ICD-10-CM | POA: Diagnosis not present

## 2019-10-18 DIAGNOSIS — N5201 Erectile dysfunction due to arterial insufficiency: Secondary | ICD-10-CM

## 2019-10-18 DIAGNOSIS — R0789 Other chest pain: Secondary | ICD-10-CM

## 2019-10-18 LAB — URINALYSIS, ROUTINE W REFLEX MICROSCOPIC
Bilirubin Urine: NEGATIVE
Hgb urine dipstick: NEGATIVE
Ketones, ur: NEGATIVE
Leukocytes,Ua: NEGATIVE
Nitrite: NEGATIVE
RBC / HPF: NONE SEEN (ref 0–?)
Specific Gravity, Urine: 1.01 (ref 1.000–1.030)
Total Protein, Urine: NEGATIVE
Urine Glucose: NEGATIVE
Urobilinogen, UA: 0.2 (ref 0.0–1.0)
pH: 6 (ref 5.0–8.0)

## 2019-10-18 LAB — CBC
HCT: 42.4 % (ref 39.0–52.0)
Hemoglobin: 14.4 g/dL (ref 13.0–17.0)
MCHC: 33.9 g/dL (ref 30.0–36.0)
MCV: 87.8 fl (ref 78.0–100.0)
Platelets: 297 10*3/uL (ref 150.0–400.0)
RBC: 4.84 Mil/uL (ref 4.22–5.81)
RDW: 14.1 % (ref 11.5–15.5)
WBC: 8.6 10*3/uL (ref 4.0–10.5)

## 2019-10-18 LAB — LIPID PANEL
Cholesterol: 200 mg/dL (ref 0–200)
HDL: 39.4 mg/dL (ref >39.00)
NonHDL: 160.79
Total CHOL/HDL Ratio: 5
Triglycerides: 217 mg/dL — ABNORMAL HIGH (ref 0.0–149.0)
VLDL: 43.4 mg/dL — ABNORMAL HIGH (ref 0.0–40.0)

## 2019-10-18 LAB — COMPREHENSIVE METABOLIC PANEL
ALT: 16 U/L (ref 0–53)
AST: 16 U/L (ref 0–37)
Albumin: 3.9 g/dL (ref 3.5–5.2)
Alkaline Phosphatase: 53 U/L (ref 39–117)
BUN: 13 mg/dL (ref 6–23)
CO2: 27 mEq/L (ref 19–32)
Calcium: 8.9 mg/dL (ref 8.4–10.5)
Chloride: 104 mEq/L (ref 96–112)
Creatinine, Ser: 0.93 mg/dL (ref 0.40–1.50)
GFR: 83.05 mL/min (ref >60.00)
Glucose, Bld: 94 mg/dL (ref 70–99)
Potassium: 4.2 mEq/L (ref 3.5–5.1)
Sodium: 138 mEq/L (ref 135–145)
Total Bilirubin: 0.5 mg/dL (ref 0.2–1.2)
Total Protein: 6.4 g/dL (ref 6.0–8.3)

## 2019-10-18 LAB — PSA: PSA: 0.33 ng/mL (ref 0.10–4.00)

## 2019-10-18 LAB — TSH: TSH: 0.94 u[IU]/mL (ref 0.35–4.50)

## 2019-10-18 LAB — LDL CHOLESTEROL, DIRECT: Direct LDL: 141 mg/dL

## 2019-10-18 MED ORDER — TRAZODONE HCL 50 MG PO TABS: ORAL_TABLET | ORAL | 3 refills | Status: DC

## 2019-10-18 MED ORDER — LISINOPRIL 20 MG PO TABS: 20.0000 mg | ORAL_TABLET | Freq: Every day | ORAL | 2 refills | Status: DC

## 2019-10-18 MED ORDER — SILDENAFIL CITRATE 20 MG PO TABS: ORAL_TABLET | ORAL | 2 refills | Status: DC

## 2019-10-18 NOTE — Patient Instructions (Signed)
Health Maintenance, Male Adopting a healthy lifestyle and getting preventive care are important in promoting health and wellness. Ask your health care provider about:  The right schedule for you to have regular tests and exams.  Things you can do on your own to prevent diseases and keep yourself healthy. What should I know about diet, weight, and exercise? Eat a healthy diet   Eat a diet that includes plenty of vegetables, fruits, low-fat dairy products, and lean protein.  Do not eat a lot of foods that are high in solid fats, added sugars, or sodium. Maintain a healthy weight Body mass index (BMI) is a measurement that can be used to identify possible weight problems. It estimates body fat based on height and weight. Your health care provider can help determine your BMI and help you achieve or maintain a healthy weight. Get regular exercise Get regular exercise. This is one of the most important things you can do for your health. Most adults should:  Exercise for at least 150 minutes each week. The exercise should increase your heart rate and make you sweat (moderate-intensity exercise).  Do strengthening exercises at least twice a week. This is in addition to the moderate-intensity exercise.  Spend less time sitting. Even light physical activity can be beneficial. Watch cholesterol and blood lipids Have your blood tested for lipids and cholesterol at 60 years of age, then have this test every 5 years. You may need to have your cholesterol levels checked more often if:  Your lipid or cholesterol levels are high.  You are older than 60 years of age.  You are at high risk for heart disease. What should I know about cancer screening? Many types of cancers can be detected early and may often be prevented. Depending on your health history and family history, you may need to have cancer screening at various ages. This may include screening for:  Colorectal cancer.  Prostate  cancer.  Skin cancer.  Lung cancer. What should I know about heart disease, diabetes, and high blood pressure? Blood pressure and heart disease  High blood pressure causes heart disease and increases the risk of stroke. This is more likely to develop in people who have high blood pressure readings, are of African descent, or are overweight.  Talk with your health care provider about your target blood pressure readings.  Have your blood pressure checked: ? Every 3-5 years if you are 60-39 years of age. ? Every year if you are 60 years old or older.  If you are between the ages of 60 and 75 and are a current or former smoker, ask your health care provider if you should have a one-time screening for abdominal aortic aneurysm (AAA). Diabetes Have regular diabetes screenings. This checks your fasting blood sugar level. Have the screening done:  Once every three years after age 60 if you are at a normal weight and have a low risk for diabetes.  More often and at a younger age if you are overweight or have a high risk for diabetes. What should I know about preventing infection? Hepatitis B If you have a higher risk for hepatitis B, you should be screened for this virus. Talk with your health care provider to find out if you are at risk for hepatitis B infection. Hepatitis C Blood testing is recommended for:  Everyone born from 1945 through 1965.  Anyone with known risk factors for hepatitis C. Sexually transmitted infections (STIs)  You should be screened each year   for STIs, including gonorrhea and chlamydia, if: ? You are sexually active and are younger than 60 years of age. ? You are older than 60 years of age and your health care provider tells you that you are at risk for this type of infection. ? Your sexual activity has changed since you were last screened, and you are at increased risk for chlamydia or gonorrhea. Ask your health care provider if you are at risk.  Ask your  health care provider about whether you are at high risk for HIV. Your health care provider may recommend a prescription medicine to help prevent HIV infection. If you choose to take medicine to prevent HIV, you should first get tested for HIV. You should then be tested every 3 months for as long as you are taking the medicine. Follow these instructions at home: Lifestyle  Do not use any products that contain nicotine or tobacco, such as cigarettes, e-cigarettes, and chewing tobacco. If you need help quitting, ask your health care provider.  Do not use street drugs.  Do not share needles.  Ask your health care provider for help if you need support or information about quitting drugs. Alcohol use  Do not drink alcohol if your health care provider tells you not to drink.  If you drink alcohol: ? Limit how much you have to 0-2 drinks a day. ? Be aware of how much alcohol is in your drink. In the [place] one drink equals one 12 oz bottle of beer (355 mL), one 5 oz glass of wine (148 mL), or one 1 oz glass of hard liquor (44 mL). General instructions  Schedule regular health, dental, and eye exams.  Stay current with your vaccines.  Tell your health care provider if: ? You often feel depressed. ? You have ever been abused or do not feel safe at home. Summary  Adopting a healthy lifestyle and getting preventive care are important in promoting health and wellness.  Follow your health care provider's instructions about healthy diet, exercising, and getting tested or screened for diseases.  Follow your health care provider's instructions on monitoring your cholesterol and blood pressure. This information is not intended to replace advice given to you by your health care provider. Make sure you discuss any questions you have with your health care provider. Document Revised: 07/22/2018 Document Reviewed: 07/22/2018 Elsevier Patient Education  2020 [employer]  Preventive Care 40-64 Years  Old, Male Preventive care refers to lifestyle choices and visits with your health care provider that can promote health and wellness. This includes:  A yearly physical exam. This is also called an annual well check.  Regular dental and eye exams.  Immunizations.  Screening for certain conditions.  Healthy lifestyle choices, such as eating a healthy diet, getting regular exercise, not using drugs or products that contain nicotine and tobacco, and limiting alcohol use. What can I expect for my preventive care visit? Physical exam Your health care provider will check:  Height and weight. These may be used to calculate body mass index (BMI), which is a measurement that tells if you are at a healthy weight.  Heart rate and blood pressure.  Your skin for abnormal spots. Counseling Your health care provider may ask you questions about:  Alcohol, tobacco, and drug use.  Emotional well-being.  Home and relationship well-being.  Sexual activity.  Eating habits.  Work and work environment. What immunizations do I need?  Influenza (flu) vaccine  This is recommended every year. Tetanus, diphtheria,   and pertussis (Tdap) vaccine  You may need a Td booster every 10 years. Varicella (chickenpox) vaccine  You may need this vaccine if you have not already been vaccinated. Zoster (shingles) vaccine  You may need this after age 63. Measles, mumps, and rubella (MMR) vaccine  You may need at least one dose of MMR if you were born in 1957 or later. You may also need a second dose. Pneumococcal conjugate (PCV13) vaccine  You may need this if you have certain conditions and were not previously vaccinated. Pneumococcal polysaccharide (PPSV23) vaccine  You may need one or two doses if you smoke cigarettes or if you have certain conditions. Meningococcal conjugate (MenACWY) vaccine  You may need this if you have certain conditions. Hepatitis A vaccine  You may need this if you have  certain conditions or if you travel or work in places where you may be exposed to hepatitis A. Hepatitis B vaccine  You may need this if you have certain conditions or if you travel or work in places where you may be exposed to hepatitis B. Haemophilus influenzae type b (Hib) vaccine  You may need this if you have certain risk factors. Human papillomavirus (HPV) vaccine  If recommended by your health care provider, you may need three doses over 6 months. You may receive vaccines as individual doses or as more than one vaccine together in one shot (combination vaccines). Talk with your health care provider about the risks and benefits of combination vaccines. What tests do I need? Blood tests  Lipid and cholesterol levels. These may be checked every 5 years, or more frequently if you are over 68 years old.  Hepatitis C test.  Hepatitis B test. Screening  Lung cancer screening. You may have this screening every year starting at age 78 if you have a 30-pack-year history of smoking and currently smoke or have quit within the past 15 years.  Prostate cancer screening. Recommendations will vary depending on your family history and other risks.  Colorectal cancer screening. All adults should have this screening starting at age 38 and continuing until age 22. Your health care provider may recommend screening at age 73 if you are at increased risk. You will have tests every 1-10 years, depending on your results and the type of screening test.  Diabetes screening. This is done by checking your blood sugar (glucose) after you have not eaten for a while (fasting). You may have this done every 1-3 years.  Sexually transmitted disease (STD) testing. Follow these instructions at home: Eating and drinking  Eat a diet that includes fresh fruits and vegetables, whole grains, lean protein, and low-fat dairy products.  Take vitamin and mineral supplements as recommended by your health care  provider.  Do not drink alcohol if your health care provider tells you not to drink.  If you drink alcohol: ? Limit how much you have to 0-2 drinks a day. ? Be aware of how much alcohol is in your drink. In the [place] one drink equals one 12 oz bottle of beer (355 mL), one 5 oz glass of wine (148 mL), or one 1 oz glass of hard liquor (44 mL). Lifestyle  Take daily care of your teeth and gums.  Stay active. Exercise for at least 30 minutes on 5 or more days each week.  Do not use any products that contain nicotine or tobacco, such as cigarettes, e-cigarettes, and chewing tobacco. If you need help quitting, ask your health care provider.  If  you are sexually active, practice safe sex. Use a condom or other form of protection to prevent STIs (sexually transmitted infections).  Talk with your health care provider about taking a low-dose aspirin every day starting at age 50. What's next?  Go to your health care provider once a year for a well check visit.  Ask your health care provider how often you should have your eyes and teeth checked.  Stay up to date on all vaccines. This information is not intended to replace advice given to you by your health care provider. Make sure you discuss any questions you have with your health care provider. Document Revised: 07/23/2018 Document Reviewed: 07/23/2018 Elsevier Patient Education  2020 Elsevier Inc.  

## 2019-10-18 NOTE — Progress Notes (Signed)
Established Patient Office Visit  Subjective:  Patient ID: Christopher Blackburn, male    DOB: 08/02/1961  Age: 59 y.o. MRN: [id number]  CC:  Chief Complaint  Patient presents with  . Annual Exam    CPE, patient has concerns about weight gain.     HPI Delio Shnayder presents for a complete physical and follow-up on his hypertension.  Blood pressures been well controlled with the lisinopril.  He has gained some weight over the last year.  Is been difficult for him to exercise with the restrictions to restrictions of the pandemic.  When he does exert himself he does feel a burning in his chest.  Denies shortness of breath nausea or diaphoresis.  He has a history of reflux that is controlled with omeprazole.  Sleeps well with as needed trazodone and melatonin.  History of micro hematuria with negative work-ups in the past.  Quit smoking smoking 30 years ago.  He is gaining weight.  He and his wife have started weight watchers.  Past Medical History:  Diagnosis Date  . GERD (gastroesophageal reflux disease)   . Hypertension     Past Surgical History:  Procedure Laterality Date  . SHOULDER SURGERY      History reviewed. No pertinent family history.  Social History   Socioeconomic History  . Marital status: Significant Other    Spouse name: Not on file  . Number of children: Not on file  . Years of education: Not on file  . Highest education level: Not on file  Occupational History  . Not on file  Tobacco Use  . Smoking status: Former Smoker    Quit date: 09/23/1992    Years since quitting: 27.0  . Smokeless tobacco: Never Used  Substance and Sexual Activity  . Alcohol use: Yes    Comment: socially and no more than 2 in a given setting.   . Drug use: Never  . Sexual activity: Not on file  Other Topics Concern  . Not on file  Social History Narrative  . Not on file   Social Determinants of Health   Financial Resource Strain:   . Difficulty of Paying Living  Expenses: Not on file  Food Insecurity:   . Worried About Charity fundraiser in the Last Year: Not on file  . Ran Out of Food in the Last Year: Not on file  Transportation Needs:   . Lack of Transportation (Medical): Not on file  . Lack of Transportation (Non-Medical): Not on file  Physical Activity:   . Days of Exercise per Week: Not on file  . Minutes of Exercise per Session: Not on file  Stress:   . Feeling of Stress : Not on file  Social Connections:   . Frequency of Communication with Friends and Family: Not on file  . Frequency of Social Gatherings with Friends and Family: Not on file  . Attends Religious Services: Not on file  . Active Member of Clubs or Organizations: Not on file  . Attends Archivist Meetings: Not on file  . Marital Status: Not on file  Intimate Partner Violence:   . Fear of Current or Ex-Partner: Not on file  . Emotionally Abused: Not on file  . Physically Abused: Not on file  . Sexually Abused: Not on file    Outpatient Medications Prior to Visit  Medication Sig Dispense Refill  . lisinopril (ZESTRIL) 20 MG tablet Take 1 tablet (20 mg total) by mouth daily. 90 tablet 2  .  sildenafil (REVATIO) 20 MG tablet May take 3-5 daily 45 minutes prior 50 tablet 2  . traZODone (DESYREL) 50 MG tablet TAKE 1/2-1 TABLETS BY MOUTH AT BEDTIME AS NEEDED FOR SLEEP. 30 tablet 3   No facility-administered medications prior to visit.    No Known Allergies  ROS Review of Systems  Constitutional: Negative.   HENT: Negative.   Eyes: Negative for photophobia and visual disturbance.  Respiratory: Negative.   Cardiovascular: Negative.   Gastrointestinal: Negative.   Endocrine: Negative for polyphagia and polyuria.  Genitourinary: Negative for difficulty urinating, hematuria and urgency.  Musculoskeletal: Negative.   Skin: Negative for pallor and rash.  Allergic/Immunologic: Negative for immunocompromised state.  Neurological: Negative for light-headedness  and headaches.  Hematological: Does not bruise/bleed easily.  Psychiatric/Behavioral: Negative.       Objective:    Physical Exam  Constitutional: He is oriented to person, place, and time. He appears well-developed and well-nourished. No distress.  HENT:  Head: Normocephalic and atraumatic.  Right Ear: External ear normal.  Left Ear: External ear normal.  Eyes: Conjunctivae are normal. Right eye exhibits no discharge. Left eye exhibits no discharge. No scleral icterus.  Neck: No JVD present. No tracheal deviation present.  Cardiovascular: Normal rate, regular rhythm and normal heart sounds.  Pulmonary/Chest: Effort normal and breath sounds normal. No stridor.  Abdominal: Bowel sounds are normal. He exhibits no distension. There is no abdominal tenderness. There is no rebound and no guarding.  Genitourinary: Rectum:     Guaiac result negative.     No rectal mass, anal fissure, tenderness, external hemorrhoid, internal hemorrhoid or abnormal anal tone.  Prostate is enlarged. Prostate is not tender.  Musculoskeletal:        General: No edema.  Neurological: He is alert and oriented to person, place, and time.  Skin: Skin is warm and dry. He is not diaphoretic.  Psychiatric: He has a normal mood and affect. His behavior is normal.   The 10-year ASCVD risk score Mikey Bussing DC Brooke Bonito., et al., 2013) is: 8.6%   Values used to calculate the score:     Age: 62 years     Sex: Male     Is Non-Hispanic African American: No     Diabetic: No     Tobacco smoker: No     Systolic Blood Pressure: [id number] mmHg     Is BP treated: Yes     HDL Cholesterol: 46.9 mg/dL     Total Cholesterol: 186 mg/dL  Of you yet thanks BP 124/72   Pulse 67   Temp 97.8 F (36.6 C) (Tympanic)   Ht 5\' 9"  (1.753 m)   Wt 237 lb 6.4 oz (107.7 kg)   SpO2 98%   BMI 35.06 kg/m  Wt Readings from Last 3 Encounters:  10/18/19 237 lb 6.4 oz (107.7 kg)  04/22/19 223 lb 4 oz (101.3 kg)  10/06/18 226 lb (102.5 kg)     Health  Maintenance Due  Topic Date Due  . Hepatitis C Screening  [birth year]  . HIV Screening  05/23/1975    There are no preventive care reminders to display for this patient.  No results found for: TSH Lab Results  Component Value Date   WBC 7.2 10/19/2018   HGB 16.1 10/19/2018   HCT 46.4 10/19/2018   MCV 88.2 10/19/2018   PLT 294.0 10/19/2018   Lab Results  Component Value Date   NA 135 04/22/2019   K 4.5 04/22/2019   CO2 27 04/22/2019   GLUCOSE  91 04/22/2019   BUN 19 04/22/2019   CREATININE 1.08 04/22/2019   BILITOT 0.8 10/19/2018   ALKPHOS 68 10/19/2018   AST 17 10/19/2018   ALT 15 10/19/2018   PROT 6.6 10/19/2018   ALBUMIN 4.1 10/19/2018   CALCIUM 8.8 04/22/2019   GFR 70.00 04/22/2019   Lab Results  Component Value Date   CHOL 186 10/19/2018   Lab Results  Component Value Date   HDL 46.90 10/19/2018   Lab Results  Component Value Date   LDLCALC 111 (H) 10/19/2018   Lab Results  Component Value Date   TRIG 140.0 10/19/2018   Lab Results  Component Value Date   CHOLHDL 4 10/19/2018   No results found for: HGBA1C    Assessment & Plan:   Problem List Items Addressed This Visit      Cardiovascular and Mediastinum   Essential hypertension - Primary   Relevant Medications   lisinopril (ZESTRIL) 20 MG tablet   sildenafil (REVATIO) 20 MG tablet   Other Relevant Orders   CBC   Comprehensive metabolic panel   Urinalysis, Routine w reflex microscopic   Erectile dysfunction due to arterial insufficiency   Relevant Medications   lisinopril (ZESTRIL) 20 MG tablet   sildenafil (REVATIO) 20 MG tablet     Other   Healthcare maintenance   Relevant Orders   Lipid panel   PSA   TSH   History of hematuria   Relevant Orders   Urinalysis, Routine w reflex microscopic   Insomnia   Relevant Medications   traZODone (DESYREL) 50 MG tablet   Discomfort in chest   Relevant Orders   Ambulatory referral to Cardiology      Meds ordered this encounter    Medications  . lisinopril (ZESTRIL) 20 MG tablet    Sig: Take 1 tablet (20 mg total) by mouth daily.    Dispense:  90 tablet    Refill:  2  . sildenafil (REVATIO) 20 MG tablet    Sig: May take 3-5 daily 45 minutes prior    Dispense:  50 tablet    Refill:  2  . traZODone (DESYREL) 50 MG tablet    Sig: TAKE 1/2-1 TABLETS BY MOUTH AT BEDTIME AS NEEDED FOR SLEEP.    Dispense:  30 tablet    Refill:  3    Follow-up: Return in about 6 months (around 04/19/2020).  Patient was given information on health maintenance and disease prevention.  He will continue with weight watchers for weight loss.  Libby Maw, MD

## 2019-10-22 ENCOUNTER — Telehealth: Payer: Self-pay | Admitting: Family Medicine

## 2019-10-22 ENCOUNTER — Other Ambulatory Visit: Payer: Self-pay

## 2019-10-22 DIAGNOSIS — G4700 Insomnia, unspecified: Secondary | ICD-10-CM

## 2019-10-22 MED ORDER — TRAZODONE HCL 50 MG PO TABS: ORAL_TABLET | ORAL | 1 refills | Status: DC

## 2019-10-22 NOTE — Telephone Encounter (Signed)
Christopher Blackburn is calling for PA for Sildenafil Citrate. CB is [phone]

## 2019-10-27 ENCOUNTER — Encounter: Payer: Self-pay | Admitting: Nurse Practitioner

## 2019-10-27 ENCOUNTER — Ambulatory Visit (INDEPENDENT_AMBULATORY_CARE_PROVIDER_SITE_OTHER): Payer: BC Managed Care – PPO | Admitting: Nurse Practitioner

## 2019-10-27 ENCOUNTER — Other Ambulatory Visit: Payer: Self-pay

## 2019-10-27 VITALS — BP 124/70 | HR 76 | Temp 97.1°F | Ht 69.0 in | Wt 234.6 lb

## 2019-10-27 DIAGNOSIS — R519 Headache, unspecified: Secondary | ICD-10-CM | POA: Diagnosis not present

## 2019-10-27 DIAGNOSIS — H90A21 Sensorineural hearing loss, unilateral, right ear, with restricted hearing on the contralateral side: Secondary | ICD-10-CM

## 2019-10-27 LAB — C-REACTIVE PROTEIN: CRP: <1 mg/dL (ref 0.5–20.0)

## 2019-10-27 LAB — SEDIMENTATION RATE: Sed Rate: 13 mm/hr (ref 0–20)

## 2019-10-27 NOTE — Patient Instructions (Signed)
You will be contacted to schedule appt for CT and with audiology.  Go to lab for blood draw.

## 2019-10-27 NOTE — Progress Notes (Signed)
Subjective:  Patient ID: Christopher Blackburn, male    DOB: [birth year]  Age: 59 y.o. MRN: [id number]  CC: Ear Pain (pt stated ear pain in right ear since Monday//pt stated feeling pressure and affecting hearing/)   Headache  This is a new problem. The current episode started yesterday. The problem has been resolved. The pain is located in the right unilateral region. The pain does not radiate. The pain quality is not similar to prior headaches. The quality of the pain is described as aching and throbbing. Associated symptoms include ear pain, hearing loss and photophobia. Pertinent negatives include no abdominal pain, abnormal behavior, anorexia, back pain, blurred vision, coughing, dizziness, drainage, eye pain, eye redness, eye watering, facial sweating, fever, insomnia, loss of balance, muscle aches, nausea, neck pain, numbness, phonophobia, rhinorrhea, scalp tenderness, seizures, sinus pressure, sore throat, swollen glands, tingling, tinnitus, visual change, vomiting, weakness or weight loss. The symptoms are aggravated by unknown. He has tried Excedrin for the symptoms. The treatment provided significant relief. His past medical history is significant for hypertension. There is no history of cancer, cluster headaches, immunosuppression, migraine headaches, migraines in the family, obesity, pseudotumor cerebri, recent head traumas, sinus disease or TMJ.  works for [employer] for last 35yrs.  Reviewed past Medical, Social and Family history today.  Outpatient Medications Prior to Visit  Medication Sig Dispense Refill  . lisinopril (ZESTRIL) 20 MG tablet Take 1 tablet (20 mg total) by mouth daily. 90 tablet 2  . sildenafil (REVATIO) 20 MG tablet May take 3-5 daily 45 minutes prior 50 tablet 2  . traZODone (DESYREL) 50 MG tablet TAKE 1/2-1 TABLETS BY MOUTH AT BEDTIME AS NEEDED FOR SLEEP. 90 tablet 1   No facility-administered medications prior to visit.    ROS See HPI  Objective:  BP  124/70   Pulse 76   Temp (!) 97.1 F (36.2 C) (Tympanic)   Ht 5\' 9"  (1.753 m)   Wt 234 lb 9.6 oz (106.4 kg)   SpO2 98%   BMI 34.64 kg/m   BP Readings from Last 3 Encounters:  10/27/19 124/70  10/18/19 124/72  04/22/19 128/80    Wt Readings from Last 3 Encounters:  10/27/19 234 lb 9.6 oz (106.4 kg)  10/18/19 237 lb 6.4 oz (107.7 kg)  04/22/19 223 lb 4 oz (101.3 kg)    Physical Exam Vitals reviewed.  HENT:     Head:     Jaw: There is normal jaw occlusion.     Salivary Glands: Right salivary gland is not diffusely enlarged or tender. Left salivary gland is not diffusely enlarged or tender.     Right Ear: Tympanic membrane, ear canal and external ear normal. Decreased hearing noted. No middle ear effusion. No mastoid tenderness.     Left Ear: Hearing, tympanic membrane, ear canal and external ear normal. No decreased hearing noted.  No middle ear effusion. No mastoid tenderness.     Ears:     Weber exam findings: lateralizes left.    Right Rinne: AC > BC.    Left Rinne: AC > BC. Cardiovascular:     Rate and Rhythm: Normal rate.     Pulses: Normal pulses.  Pulmonary:     Effort: Pulmonary effort is normal.  Skin:    Findings: No erythema or rash.  Neurological:     Mental Status: He is alert and oriented to person, place, and time.    Lab Results  Component Value Date   WBC 8.6 10/18/2019  HGB 14.4 10/18/2019   HCT 42.4 10/18/2019   PLT 297.0 10/18/2019   GLUCOSE 94 10/18/2019   CHOL 200 10/18/2019   TRIG 217.0 (H) 10/18/2019   HDL 39.40 10/18/2019   LDLDIRECT 141.0 10/18/2019   LDLCALC 111 (H) 10/19/2018   ALT 16 10/18/2019   AST 16 10/18/2019   NA 138 10/18/2019   K 4.2 10/18/2019   CL 104 10/18/2019   CREATININE 0.93 10/18/2019   BUN 13 10/18/2019   CO2 27 10/18/2019   TSH 0.94 10/18/2019   PSA 0.33 10/18/2019    Assessment & Plan:  This visit occurred during the SARS-CoV-2 public health emergency.  Safety protocols were in place, including  screening questions prior to the visit, additional usage of staff PPE, and extensive cleaning of exam room while observing appropriate contact time as indicated for disinfecting solutions.   Thuan was seen today for ear pain.  Diagnoses and all orders for this visit:  Sensorineural hearing loss (SNHL) of right ear with restricted hearing of left ear -     C-reactive protein -     Sedimentation rate -     Ambulatory referral to Audiology  Acute nonintractable headache, unspecified headache type -     C-reactive protein -     Sedimentation rate -     CT TEMPORAL BONES WO CONTRAST; Future   I am having [name] maintain his lisinopril, sildenafil, and traZODone.  No orders of the defined types were placed in this encounter.   Problem List Items Addressed This Visit    None    Visit Diagnoses    Sensorineural hearing loss (SNHL) of right ear with restricted hearing of left ear    -  Primary   Relevant Orders   C-reactive protein   Sedimentation rate   Ambulatory referral to Audiology   Acute nonintractable headache, unspecified headache type       Relevant Orders   C-reactive protein   Sedimentation rate   CT TEMPORAL BONES WO CONTRAST      Follow-up: Return if symptoms worsen or fail to improve.  Wilfred Lacy, NP

## 2019-10-29 NOTE — Telephone Encounter (Signed)
Called number provided LM for Cherelle to return the call

## 2019-11-01 ENCOUNTER — Telehealth: Payer: Self-pay | Admitting: Family Medicine

## 2019-11-01 NOTE — Telephone Encounter (Signed)
Christopher Blackburn is calling and stated that the pharmacy needs PA for sildenafil 20 mg. CB is [phone]. Case ID [id number]

## 2019-11-02 ENCOUNTER — Encounter: Payer: Self-pay | Admitting: Cardiology

## 2019-11-02 ENCOUNTER — Other Ambulatory Visit: Payer: Self-pay

## 2019-11-02 ENCOUNTER — Ambulatory Visit: Payer: BC Managed Care – PPO | Admitting: Cardiology

## 2019-11-02 ENCOUNTER — Encounter: Payer: Self-pay | Admitting: *Deleted

## 2019-11-02 VITALS — BP 126/86 | HR 75 | Ht 70.0 in | Wt 234.0 lb

## 2019-11-02 DIAGNOSIS — R0789 Other chest pain: Secondary | ICD-10-CM | POA: Diagnosis not present

## 2019-11-02 DIAGNOSIS — I10 Essential (primary) hypertension: Secondary | ICD-10-CM

## 2019-11-02 DIAGNOSIS — E785 Hyperlipidemia, unspecified: Secondary | ICD-10-CM | POA: Diagnosis not present

## 2019-11-02 MED ORDER — ASPIRIN EC 81 MG PO TBEC: 81.0000 mg | DELAYED_RELEASE_TABLET | Freq: Every day | ORAL | 3 refills | Status: DC

## 2019-11-02 MED ORDER — ATORVASTATIN CALCIUM 20 MG PO TABS: 20.0000 mg | ORAL_TABLET | Freq: Every day | ORAL | 3 refills | Status: DC

## 2019-11-02 MED ORDER — NITROGLYCERIN 0.4 MG SL SUBL: 0.4000 mg | SUBLINGUAL_TABLET | SUBLINGUAL | 3 refills | Status: DC | PRN

## 2019-11-02 NOTE — Patient Instructions (Addendum)
Medication Instructions:  Your physician has recommended you make the following change in your medication:  1.  START Aspirin 81 mg daily 2.  START Lipitor 20 mg daily 3.  START Nitroglycerin 0.4 s/l tablet USE AS DIRECTED ON THE BOTTLE.   *If you need a refill on your cardiac medications before your next appointment, please call your pharmacy*   Lab Work: None ordered  If you have labs (blood work) drawn today and your tests are completely normal, you will receive your results only by: Marland Kitchen MyChart Message (if you have MyChart) OR . A paper copy in the mail If you have any lab test that is abnormal or we need to change your treatment, we will call you to review the results.   Testing/Procedures: Your physician has requested that you have a lexiscan myoview. For further information please visit [email or web address]. Please follow instruction sheet, as given.    ZIO XT- Long Term Monitor Instructions   Your physician has requested you wear your ZIO patch monitor_______days.   This is a single patch monitor.  [employer] supplies one patch monitor per enrollment.  Additional stickers are not available.   Please do not apply patch if you will be having a Nuclear Stress Test, Echocardiogram, Cardiac CT, MRI, or Chest Xray during the time frame you would be wearing the monitor. The patch cannot be worn during these tests.  You cannot remove and re-apply the ZIO XT patch monitor.   Your ZIO patch monitor will be sent USPS Priority mail from [facility] directly to your home address. The monitor may also be mailed to a PO BOX if home delivery is not available.   It may take 3-5 days to receive your monitor after you have been enrolled.   Once you have received you monitor, please review enclosed instructions.  Your monitor has already been registered assigning a specific monitor serial # to you.   Applying the monitor   Shave hair from upper left chest.   Hold abrader disc by orange  tab.  Rub abrader in 40 strokes over left upper chest as indicated in your monitor instructions.   Clean area with 4 enclosed alcohol pads .  Use all pads to assure are is cleaned thoroughly.  Let dry.   Apply patch as indicated in monitor instructions.  Patch will be place under collarbone on left side of chest with arrow pointing upward.   Rub patch adhesive wings for 2 minutes.Remove white label marked "1".  Remove white label marked "2".  Rub patch adhesive wings for 2 additional minutes.   While looking in a mirror, press and release button in center of patch.  A small green light will flash 3-4 times .  This will be your only indicator the monitor has been turned on.     Do not shower for the first 24 hours.  You may shower after the first 24 hours.   Press button if you feel a symptom. You will hear a small click.  Record Date, Time and Symptom in the Patient Log Book.   When you are ready to remove patch, follow instructions on last 2 pages of Patient Log Book.  Stick patch monitor onto last page of Patient Log Book.   Place Patient Log Book in [place] box.  Use locking tab on box and tape box closed securely.  The [employer] and [employer] has [employer] on it.  Please place in mailbox as soon as possible.  Your physician  should have your test results approximately 7 days after the monitor has been mailed back to [facility].   Call [place] at [phone] if you have questions regarding your ZIO XT patch monitor.  Call them immediately if you see an orange light blinking on your monitor.   If your monitor falls off in less than 4 days contact our Monitor department at [phone].  If your monitor becomes loose or falls off after 4 days call [employer] at [phone] for suggestions on securing your monitor.    Follow-Up: At [facility], you and your health needs are our priority.  As part of our continuing mission to provide you with exceptional heart  care, we have created designated Provider Care Teams.  These Care Teams include your primary Cardiologist (physician) and Advanced Practice Providers (APPs -  Physician Assistants and Nurse Practitioners) who all work together to provide you with the care you need, when you need it.  We recommend signing up for the patient portal called "MyChart".  Sign up information is provided on this After Visit Summary.  MyChart is used to connect with patients for Virtual Visits (Telemedicine).  Patients are able to view lab/test results, encounter notes, upcoming appointments, etc.  Non-urgent messages can be sent to your provider as well.   To learn more about what you can do with MyChart, go to [email or web address].    Your next appointment:   1 month(s)  The format for your next appointment:   In Person  Provider:   Jenne Campus, MD   Other Instructions   Cardiac Nuclear Scan A cardiac nuclear scan is a test that is done to check the flow of blood to your heart. It is done when you are resting and when you are exercising. The test looks for problems such as:  Not enough blood reaching a portion of the heart.  The heart muscle not working as it should. You may need this test if:  You have heart disease.  You have had lab results that are not normal.  You have had heart surgery or a balloon procedure to open up blocked arteries (angioplasty).  You have chest pain.  You have shortness of breath. In this test, a special dye (tracer) is put into your bloodstream. The tracer will travel to your heart. A camera will then take pictures of your heart to see how the tracer moves through your heart. This test is usually done at a hospital and takes 2-4 hours. Tell a doctor about:  Any allergies you have.  All medicines you are taking, including vitamins, herbs, eye drops, creams, and over-the-counter medicines.  Any problems you or family members have had with anesthetic  medicines.  Any blood disorders you have.  Any surgeries you have had.  Any medical conditions you have.  Whether you are pregnant or may be pregnant. What are the risks? Generally, this is a safe test. However, problems may occur, such as:  Serious chest pain and heart attack. This is only a risk if the stress portion of the test is done.  Rapid heartbeat.  A feeling of warmth in your chest. This feeling usually does not last long.  Allergic reaction to the tracer. What happens before the test?  Ask your doctor about changing or stopping your normal medicines. This is important.  Follow instructions from your doctor about what you cannot eat or drink.  Remove your jewelry on the day of the test. What happens during the test?  An IV tube will be inserted into one of your veins.  Your doctor will give you a small amount of tracer through the IV tube.  You will wait for 20-40 minutes while the tracer moves through your bloodstream.  Your heart will be monitored with an electrocardiogram (ECG).  You will lie down on an exam table.  Pictures of your heart will be taken for about 15-20 minutes.  You may also have a stress test. For this test, one of these things may be done: ? You will be asked to exercise on a treadmill or a stationary bike. ? You will be given medicines that will make your heart work harder. This is done if you are unable to exercise.  When blood flow to your heart has peaked, a tracer will again be given through the IV tube.  After 20-40 minutes, you will get back on the exam table. More pictures will be taken of your heart.  Depending on the tracer that is used, more pictures may need to be taken 3-4 hours later.  Your IV tube will be removed when the test is over. The test may vary among doctors and hospitals. What happens after the test?  Ask your doctor: ? Whether you can return to your normal schedule, including diet, activities, and  medicines. ? Whether you should drink more fluids. This will help to remove the tracer from your body. Drink enough fluid to keep your pee (urine) pale yellow.  Ask your doctor, or the department that is doing the test: ? When will my results be ready? ? How will I get my results? Summary  A cardiac nuclear scan is a test that is done to check the flow of blood to your heart.  Tell your doctor whether you are pregnant or may be pregnant.  Before the test, ask your doctor about changing or stopping your normal medicines. This is important.  Ask your doctor whether you can return to your normal activities. You may be asked to drink more fluids. This information is not intended to replace advice given to you by your health care provider. Make sure you discuss any questions you have with your health care provider. Document Revised: 11/18/2018 Document Reviewed: 01/12/2018 Elsevier Patient Education  [place].

## 2019-11-02 NOTE — Progress Notes (Signed)
Cardiology Consultation:    Date:  11/02/2019   ID:  [name], DOB August 09, 1961, MRN [id number]  PCP:  Libby Maw, MD  Cardiologist:  Jenne Campus, MD   Referring MD: Libby Maw   No chief complaint on file. I have a chest pain  History of Present Illness:    Christopher Blackburn is a 59 y.o. male who is being seen today for the evaluation of chest pain at the request of Libby Maw,*.  With past medical history significant for dyslipidemia, essential hypertension, rectal dysfunction, he was referred to [place] because of chest pain.  For a month a month he noticed chest tightness and burning that happened when he walks with his dog.  The walk involves some uphill walk and when he does this he will get this sensation he continue walking then when he which to he will ground get flat and then the sensation goes away there is some shortness of breath as well as sweating associated with this sensation but it difficult to judge if this is sweating and shortness of because of exercise or because of this sensation in the chest.  Entire sensation last for few minutes.  Its not getting worse but it is predictable.  At the same time just few days ago he was able to work hard in the garden and carrying heavy stuff and he did not have any symptoms. He quit smoking many years ago He does have hypertension His LDL is elevated 141, not on statin. He does not exercise on the regular basis  Past Medical History:  Diagnosis Date  . GERD (gastroesophageal reflux disease)   . Hypertension     Past Surgical History:  Procedure Laterality Date  . SHOULDER SURGERY      Current Medications: Current Meds  Medication Sig  . lisinopril (ZESTRIL) 20 MG tablet Take 1 tablet (20 mg total) by mouth daily.  . sildenafil (REVATIO) 20 MG tablet May take 3-5 daily 45 minutes prior  . traZODone (DESYREL) 50 MG tablet TAKE 1/2-1 TABLETS BY MOUTH AT BEDTIME AS NEEDED FOR  SLEEP.     Allergies:   Patient has no known allergies.   Social History   Socioeconomic History  . Marital status: Significant Other    Spouse name: Not on file  . Number of children: Not on file  . Years of education: Not on file  . Highest education level: Not on file  Occupational History  . Not on file  Tobacco Use  . Smoking status: Former Smoker    Quit date: 09/23/1992    Years since quitting: 27.1  . Smokeless tobacco: Never Used  Substance and Sexual Activity  . Alcohol use: Yes    Comment: socially and no more than 2 in a given setting.   . Drug use: Never  . Sexual activity: Not on file  Other Topics Concern  . Not on file  Social History Narrative  . Not on file   Social Determinants of Health   Financial Resource Strain:   . Difficulty of Paying Living Expenses:   Food Insecurity:   . Worried About Charity fundraiser in the Last Year:   . Arboriculturist in the Last Year:   Transportation Needs:   . Film/video editor (Medical):   Marland Kitchen Lack of Transportation (Non-Medical):   Physical Activity:   . Days of Exercise per Week:   . Minutes of Exercise per Session:   Stress:   .  Feeling of Stress :   Social Connections:   . Frequency of Communication with Friends and Family:   . Frequency of Social Gatherings with Friends and Family:   . Attends Religious Services:   . Active Member of Clubs or Organizations:   . Attends Archivist Meetings:   Marland Kitchen Marital Status:      Family History: The patient's family history is not on file. ROS:   Please see the history of present illness.    All 14 point review of systems negative except as described per history of present illness.  EKGs/Labs/Other Studies Reviewed:    The following studies were reviewed today:   EKG:  EKG is  ordered today.  The ekg ordered today demonstrates EKG showed ectopic atrial rhythm, nonspecific ST segment changes.  Recent Labs: 10/18/2019: ALT 16; BUN 13; Creatinine,  Ser 0.93; Hemoglobin 14.4; Platelets 297.0; Potassium 4.2; Sodium 138; TSH 0.94  Recent Lipid Panel    Component Value Date/Time   CHOL 200 10/18/2019 1047   TRIG 217.0 (H) 10/18/2019 1047   HDL 39.40 10/18/2019 1047   CHOLHDL 5 10/18/2019 1047   VLDL 43.4 (H) 10/18/2019 1047   LDLCALC 111 (H) 10/19/2018 0754   LDLDIRECT 141.0 10/18/2019 1047    Physical Exam:    VS:  BP 126/86   Pulse 75   Ht 5\' 10"  (1.778 m)   Wt 234 lb (106.1 kg)   BMI 33.58 kg/m     Wt Readings from Last 3 Encounters:  11/02/19 234 lb (106.1 kg)  10/27/19 234 lb 9.6 oz (106.4 kg)  10/18/19 237 lb 6.4 oz (107.7 kg)     GEN:  Well nourished, well developed in no acute distress HEENT: Normal NECK: No JVD; No carotid bruits LYMPHATICS: No lymphadenopathy CARDIAC: RRR, no murmurs, no rubs, no gallops RESPIRATORY:  Clear to auscultation without rales, wheezing or rhonchi  ABDOMEN: Soft, non-tender, non-distended MUSCULOSKELETAL:  No edema; No deformity  SKIN: Warm and dry NEUROLOGIC:  Alert and oriented x 3 PSYCHIATRIC:  Normal affect   ASSESSMENT:    1. Discomfort in chest   2. Essential hypertension   3. Dyslipidemia    PLAN:    In order of problems listed above:  1. Chest pain fairly suspicious for exertional angina.  [place] classification is 2.  It happened only with extreme exertion interestingly he said lately does not happen.  It is possible that he was happening only in the cold today.  I asked him to start taking 1 baby aspirin every single day.  I will give a prescription for nitroglycerin with instruction to take it when he gets chest pain.  He was also instructed to go to the emergency room or call 911 if pain is not relieved with 3 nitroglycerin.  He will be scheduled to have a stress test to find out if this is truly ischemia as well as stratify his risks 2. Essential hypertension blood pressure seems to be well controlled, I will continue present management. 3. Dyslipidemia I think  we need to initiate statin.  I will start him on Lipitor 10 mg daily. 4.  Ectopic atrial rhythm.  This is surprising finding.  I will ask you to wear Zio patch for a week to see how often he had abnormal atrial rhythm.  Medication Adjustments/Labs and Tests Ordered: Current medicines are reviewed at length with the patient today.  Concerns regarding medicines are outlined above.  No orders of the defined types were placed in  this encounter.  No orders of the defined types were placed in this encounter.   Signed, Park Liter, MD, [facility]. 11/02/2019 3:00 PM    [place]

## 2019-11-04 ENCOUNTER — Telehealth (HOSPITAL_COMMUNITY): Payer: Self-pay | Admitting: *Deleted

## 2019-11-04 NOTE — Telephone Encounter (Signed)
Patient given detailed instructions per Myocardial Perfusion Study Information Sheet for the test on 11/05/19 at 7:15. Patient notified to arrive 15 minutes early and that it is imperative to arrive on time for appointment to keep from having the test rescheduled.  If you need to cancel or reschedule your appointment, please call the office within 24 hours of your appointment. . Patient verbalized understanding.Christopher Blackburn

## 2019-11-05 ENCOUNTER — Other Ambulatory Visit: Payer: Self-pay

## 2019-11-05 ENCOUNTER — Ambulatory Visit (HOSPITAL_COMMUNITY): Payer: BC Managed Care – PPO | Attending: Cardiology

## 2019-11-05 DIAGNOSIS — R0789 Other chest pain: Secondary | ICD-10-CM | POA: Diagnosis not present

## 2019-11-05 DIAGNOSIS — I10 Essential (primary) hypertension: Secondary | ICD-10-CM | POA: Diagnosis not present

## 2019-11-05 DIAGNOSIS — E785 Hyperlipidemia, unspecified: Secondary | ICD-10-CM

## 2019-11-05 LAB — MYOCARDIAL PERFUSION IMAGING
LV dias vol: 70 mL (ref 62–150)
LV sys vol: 22 mL
Peak HR: 118 {beats}/min
Rest HR: 79 {beats}/min
SDS: 0
SRS: 0
SSS: 0
TID: 0.99

## 2019-11-05 MED ORDER — REGADENOSON 0.4 MG/5ML IV SOLN
0.4000 mg | Freq: Once | INTRAVENOUS | Status: AC
  Administered 2019-11-05: 0.4 mg via INTRAVENOUS

## 2019-11-05 MED ORDER — TECHNETIUM TC 99M TETROFOSMIN IV KIT
10.1000 | PACK | Freq: Once | INTRAVENOUS | Status: AC | PRN
  Administered 2019-11-05: 10.1 via INTRAVENOUS
  Filled 2019-11-05: qty 11

## 2019-11-05 MED ORDER — TECHNETIUM TC 99M TETROFOSMIN IV KIT
31.6000 | PACK | Freq: Once | INTRAVENOUS | Status: AC | PRN
  Administered 2019-11-05: 31.6 via INTRAVENOUS
  Filled 2019-11-05: qty 32

## 2019-11-09 ENCOUNTER — Telehealth: Payer: Self-pay | Admitting: Emergency Medicine

## 2019-11-09 NOTE — Telephone Encounter (Signed)
Left results on patient's voicemail per dpr. Advised patient to return call with any questions.

## 2019-11-19 ENCOUNTER — Other Ambulatory Visit: Payer: BC Managed Care – PPO

## 2019-12-03 ENCOUNTER — Ambulatory Visit: Payer: BC Managed Care – PPO | Admitting: Cardiology

## 2019-12-03 ENCOUNTER — Other Ambulatory Visit: Payer: Self-pay

## 2019-12-03 ENCOUNTER — Encounter: Payer: Self-pay | Admitting: Cardiology

## 2019-12-03 VITALS — BP 144/84 | HR 80 | Ht 70.0 in | Wt 239.0 lb

## 2019-12-03 DIAGNOSIS — R0789 Other chest pain: Secondary | ICD-10-CM

## 2019-12-03 DIAGNOSIS — I10 Essential (primary) hypertension: Secondary | ICD-10-CM | POA: Diagnosis not present

## 2019-12-03 DIAGNOSIS — E785 Hyperlipidemia, unspecified: Secondary | ICD-10-CM

## 2019-12-03 MED ORDER — RANOLAZINE ER 500 MG PO TB12: 500.0000 mg | ORAL_TABLET | Freq: Two times a day (BID) | ORAL | 3 refills | Status: DC

## 2019-12-03 NOTE — Patient Instructions (Addendum)
Medication Instructions:  Your physician has recommended you make the following change in your medication: 1. Start Ranexa one tablet ( 500 mg) twice a day, sent into requested pharmacy.    Labwork: -None  Testing/Procedures: -None  Follow-Up: Your physician recommends that you keep your scheduled a follow-up appointment in May with Dr. Fraser Din.    Any Other Special Instructions Will Be Listed Below (If Applicable).     If you need a refill on your cardiac medications before your next appointment, please call your pharmacy.

## 2019-12-03 NOTE — Progress Notes (Signed)
Cardiology Office Note:    Date:  12/03/2019   ID:  Christopher Blackburn, DOB 02/17/1961, MRN [id number]  PCP:  Libby Maw, MD  Cardiologist:  Jenne Campus, MD    Referring MD: Libby Maw,*   Chief Complaint  Patient presents with  . Follow-up    1 Month    History of Present Illness:    Christopher Blackburn is a 59 y.o. male who was referred to [place] originally because of chest pain.  Pain was fairly suspicious symptoms happened with exercise but only with extreme exertion.  We did stress testing stress test shows no evidence of ischemia he said that when he walks around and he walks a lot he has absolutely no problem doing it however when he climbs uphill he will get short of breath as well as a little chest sensation.  Last time when I saw him he also got hyperlipidemia initiated statin.  Past Medical History:  Diagnosis Date  . GERD (gastroesophageal reflux disease)   . Hypertension     Past Surgical History:  Procedure Laterality Date  . SHOULDER SURGERY      Current Medications: Current Meds  Medication Sig  . aspirin EC 81 MG tablet Take 1 tablet (81 mg total) by mouth daily.  Marland Kitchen atorvastatin (LIPITOR) 20 MG tablet Take 1 tablet (20 mg total) by mouth daily.  Marland Kitchen lisinopril (ZESTRIL) 20 MG tablet Take 1 tablet (20 mg total) by mouth daily.  . nitroGLYCERIN (NITROSTAT) 0.4 MG SL tablet Place 1 tablet (0.4 mg total) under the tongue every 5 (five) minutes as needed.  . sildenafil (REVATIO) 20 MG tablet May take 3-5 daily 45 minutes prior  . traZODone (DESYREL) 50 MG tablet TAKE 1/2-1 TABLETS BY MOUTH AT BEDTIME AS NEEDED FOR SLEEP.     Allergies:   Patient has no known allergies.   Social History   Socioeconomic History  . Marital status: Married    Spouse name: Not on file  . Number of children: Not on file  . Years of education: Not on file  . Highest education level: Not on file  Occupational History  . Not on file  Tobacco Use  .  Smoking status: Former Smoker    Quit date: 09/23/1992    Years since quitting: 27.2  . Smokeless tobacco: Never Used  Substance and Sexual Activity  . Alcohol use: Yes    Comment: socially and no more than 2 in a given setting.   . Drug use: Never  . Sexual activity: Not on file  Other Topics Concern  . Not on file  Social History Narrative  . Not on file   Social Determinants of Health   Financial Resource Strain:   . Difficulty of Paying Living Expenses:   Food Insecurity:   . Worried About Charity fundraiser in the Last Year:   . Arboriculturist in the Last Year:   Transportation Needs:   . Film/video editor (Medical):   Marland Kitchen Lack of Transportation (Non-Medical):   Physical Activity:   . Days of Exercise per Week:   . Minutes of Exercise per Session:   Stress:   . Feeling of Stress :   Social Connections:   . Frequency of Communication with Friends and Family:   . Frequency of Social Gatherings with Friends and Family:   . Attends Religious Services:   . Active Member of Clubs or Organizations:   . Attends Archivist Meetings:   .  Marital Status:      Family History: The patient's family history is not on file. ROS:   Please see the history of present illness.    All 14 point review of systems negative except as described per history of present illness  EKGs/Labs/Other Studies Reviewed:      Recent Labs: 10/18/2019: ALT 16; BUN 13; Creatinine, Ser 0.93; Hemoglobin 14.4; Platelets 297.0; Potassium 4.2; Sodium 138; TSH 0.94  Recent Lipid Panel    Component Value Date/Time   CHOL 200 10/18/2019 1047   TRIG 217.0 (H) 10/18/2019 1047   HDL 39.40 10/18/2019 1047   CHOLHDL 5 10/18/2019 1047   VLDL 43.4 (H) 10/18/2019 1047   LDLCALC 111 (H) 10/19/2018 0754   LDLDIRECT 141.0 10/18/2019 1047    Physical Exam:    VS:  BP (!) 144/84   Pulse 80   Ht 5\' 10"  (1.778 m)   Wt 239 lb (108.4 kg)   SpO2 98%   BMI 34.29 kg/m     Wt Readings from Last 3  Encounters:  12/03/19 239 lb (108.4 kg)  11/05/19 234 lb (106.1 kg)  11/02/19 234 lb (106.1 kg)     GEN:  Well nourished, well developed in no acute distress HEENT: Normal NECK: No JVD; No carotid bruits LYMPHATICS: No lymphadenopathy CARDIAC: RRR, no murmurs, no rubs, no gallops RESPIRATORY:  Clear to auscultation without rales, wheezing or rhonchi  ABDOMEN: Soft, non-tender, non-distended MUSCULOSKELETAL:  No edema; No deformity  SKIN: Warm and dry LOWER EXTREMITIES: no swelling NEUROLOGIC:  Alert and oriented x 3 PSYCHIATRIC:  Normal affect   ASSESSMENT:    1. Discomfort in chest   2. Dyslipidemia   3. Essential hypertension    PLAN:    In order of problems listed above:  1. Discomfort in the chest with suspicion for angina pectoris.  I will initiate ranolazine 500 mg twice daily.  Then I meet with him in about a month to decide what to do next at if his symptoms are complete relief we can stop at this point or if not we may consider doing coronary CT angio.  In the meantime we will concentrate on risk factors modifications. 2. Dyslipidemia he is on Lipitor 20 which I will continue.  We will check his fasting lipid profile when he will be here next time.  Which will be in a month. 3. Essential hypertension.  Blood pressure slightly on the higher side.  We will continue monitoring this in the future probably required higher dose of ACE inhibitor.    Medication Adjustments/Labs and Tests Ordered: Current medicines are reviewed at length with the patient today.  Concerns regarding medicines are outlined above.  No orders of the defined types were placed in this encounter.  Medication changes: No orders of the defined types were placed in this encounter.   Signed, Park Liter, MD, [facility] 12/03/2019 3:21 PM    [place]

## 2019-12-03 NOTE — Addendum Note (Signed)
Addended by: Tamsen Snider on: 12/03/2019 03:29 PM   Modules accepted: Orders

## 2019-12-24 ENCOUNTER — Ambulatory Visit: Payer: BC Managed Care – PPO | Admitting: Cardiology

## 2020-03-14 ENCOUNTER — Other Ambulatory Visit: Payer: Self-pay | Admitting: Family Medicine

## 2020-03-14 DIAGNOSIS — N5201 Erectile dysfunction due to arterial insufficiency: Secondary | ICD-10-CM

## 2020-03-14 NOTE — Telephone Encounter (Signed)
Is he taking any nitrates?

## 2020-03-14 NOTE — Telephone Encounter (Signed)
Last OV w/[facility]che 10/27/19 Last fill 10/18/19   #50/2

## 2020-03-18 NOTE — Telephone Encounter (Signed)
Left message on voicemail to call office.  

## 2020-03-18 NOTE — Telephone Encounter (Signed)
I spoke with pt and he informed me that he is not taking any nitrates.

## 2020-04-03 ENCOUNTER — Other Ambulatory Visit: Payer: Self-pay | Admitting: Family Medicine

## 2020-04-03 DIAGNOSIS — G4700 Insomnia, unspecified: Secondary | ICD-10-CM

## 2020-04-18 ENCOUNTER — Other Ambulatory Visit: Payer: Self-pay

## 2020-04-19 ENCOUNTER — Ambulatory Visit: Payer: BC Managed Care – PPO | Admitting: Family Medicine

## 2020-04-19 ENCOUNTER — Ambulatory Visit (INDEPENDENT_AMBULATORY_CARE_PROVIDER_SITE_OTHER): Payer: BC Managed Care – PPO

## 2020-04-19 ENCOUNTER — Encounter: Payer: Self-pay | Admitting: Family Medicine

## 2020-04-19 VITALS — BP 126/66 | HR 92 | Temp 98.0°F | Ht 70.0 in | Wt 238.6 lb

## 2020-04-19 DIAGNOSIS — I10 Essential (primary) hypertension: Secondary | ICD-10-CM | POA: Diagnosis not present

## 2020-04-19 DIAGNOSIS — R0602 Shortness of breath: Secondary | ICD-10-CM | POA: Diagnosis not present

## 2020-04-19 DIAGNOSIS — E7800 Pure hypercholesterolemia, unspecified: Secondary | ICD-10-CM | POA: Insufficient documentation

## 2020-04-19 LAB — BASIC METABOLIC PANEL
BUN: 13 mg/dL (ref 6–23)
CO2: 26 mEq/L (ref 19–32)
Calcium: 8.8 mg/dL (ref 8.4–10.5)
Chloride: 103 mEq/L (ref 96–112)
Creatinine, Ser: 0.89 mg/dL (ref 0.40–1.50)
GFR: 87.22 mL/min (ref >60.00)
Glucose, Bld: 95 mg/dL (ref 70–99)
Potassium: 4.2 mEq/L (ref 3.5–5.1)
Sodium: 137 mEq/L (ref 135–145)

## 2020-04-19 LAB — LDL CHOLESTEROL, DIRECT: Direct LDL: 136 mg/dL

## 2020-04-19 MED ORDER — ATORVASTATIN CALCIUM 20 MG PO TABS: 20.0000 mg | ORAL_TABLET | Freq: Every day | ORAL | 3 refills | Status: DC

## 2020-04-19 NOTE — Progress Notes (Signed)
Established Patient Office Visit  Subjective:  Patient ID: Christopher Blackburn, male    DOB: 1961/06/04  Age: 59 y.o. MRN: [id number]  CC:  Chief Complaint  Patient presents with  . Follow-up    F/U on BP patient states that he still has SOB that come and go sometimes.     HPI Christopher Blackburn presents for follow-up of hypertension and shortness of breath.  Status post cardiac work-up with normal stress test.  Shortness of breath persists especially when it is hot and he is exerting himself.  It does not seem to be a factor in cooler weather.  He denies a history of asthma or COPD.  He quit smoking in 1994.  Dad had developed chronic bronchitis and COPD but he had smoked throughout his life.  Patient developed severe muscle cramps after playing golf when he had drink beer while playing.  EMS came and gave him an electrolyte solution and they resolved.   Past Medical History:  Diagnosis Date  . GERD (gastroesophageal reflux disease)   . Hypertension     Past Surgical History:  Procedure Laterality Date  . SHOULDER SURGERY      No family history on file.  Social History   Socioeconomic History  . Marital status: Married    Spouse name: Not on file  . Number of children: Not on file  . Years of education: Not on file  . Highest education level: Not on file  Occupational History  . Not on file  Tobacco Use  . Smoking status: Former Smoker    Quit date: 09/23/1992    Years since quitting: 27.5  . Smokeless tobacco: Never Used  Substance and Sexual Activity  . Alcohol use: Yes    Comment: socially and no more than 2 in a given setting.   . Drug use: Never  . Sexual activity: Not on file  Other Topics Concern  . Not on file  Social History Narrative  . Not on file   Social Determinants of Health   Financial Resource Strain:   . Difficulty of Paying Living Expenses: Not on file  Food Insecurity:   . Worried About Charity fundraiser in the Last Year: Not on file    . Ran Out of Food in the Last Year: Not on file  Transportation Needs:   . Lack of Transportation (Medical): Not on file  . Lack of Transportation (Non-Medical): Not on file  Physical Activity:   . Days of Exercise per Week: Not on file  . Minutes of Exercise per Session: Not on file  Stress:   . Feeling of Stress : Not on file  Social Connections:   . Frequency of Communication with Friends and Family: Not on file  . Frequency of Social Gatherings with Friends and Family: Not on file  . Attends Religious Services: Not on file  . Active Member of Clubs or Organizations: Not on file  . Attends Archivist Meetings: Not on file  . Marital Status: Not on file  Intimate Partner Violence:   . Fear of Current or Ex-Partner: Not on file  . Emotionally Abused: Not on file  . Physically Abused: Not on file  . Sexually Abused: Not on file    Outpatient Medications Prior to Visit  Medication Sig Dispense Refill  . aspirin EC 81 MG tablet Take 1 tablet (81 mg total) by mouth daily. 90 tablet 3  . lisinopril (ZESTRIL) 20 MG tablet Take 1 tablet (20  mg total) by mouth daily. 90 tablet 2  . sildenafil (REVATIO) 20 MG tablet TAKE THREE TO FIVE TABLETS BY MOUTH DAILY FOURTY-FIVE MINUTES PRIOR TO ACTIVITY AS NEEDED 50 tablet 2  . traZODone (DESYREL) 50 MG tablet TAKE ONE-HALF (1/2) TO ONE TABLET AT BEDTIME AS NEEDED FOR SLEEP 90 tablet 3  . nitroGLYCERIN (NITROSTAT) 0.4 MG SL tablet Place 1 tablet (0.4 mg total) under the tongue every 5 (five) minutes as needed. 25 tablet 3  . ranolazine (RANEXA) 500 MG 12 hr tablet Take 1 tablet (500 mg total) by mouth 2 (two) times daily. (Patient not taking: Reported on 04/19/2020) 90 tablet 3  . atorvastatin (LIPITOR) 20 MG tablet Take 1 tablet (20 mg total) by mouth daily. 90 tablet 3   No facility-administered medications prior to visit.    No Known Allergies  ROS Review of Systems  Constitutional: Negative.   HENT: Negative.   Eyes: Negative  for photophobia and visual disturbance.  Respiratory: Positive for shortness of breath. Negative for chest tightness and wheezing.   Cardiovascular: Negative for chest pain, palpitations and leg swelling.  Gastrointestinal: Negative.   Genitourinary: Negative.   Psychiatric/Behavioral: Positive for sleep disturbance.   Depression screen [facility] 2/9 04/19/2020 10/18/2019 10/18/2019  Decreased Interest 0 1 1  Down, Depressed, Hopeless 0 0 0  PHQ - 2 Score 0 1 1  Altered sleeping 0 2 -  Tired, decreased energy 0 1 -  Change in appetite 0 0 -  Feeling bad or failure about yourself  0 0 -  Trouble concentrating 0 0 -  Moving slowly or fidgety/restless 0 0 -  Suicidal thoughts 0 0 -  PHQ-9 Score 0 4 -  Difficult doing work/chores Not difficult at all Not difficult at all -      Objective:    Physical Exam Vitals and nursing note reviewed.  Constitutional:      General: He is not in acute distress.    Appearance: Normal appearance. He is obese. He is not ill-appearing, toxic-appearing or diaphoretic.  HENT:     Head: Normocephalic and atraumatic.     Right Ear: External ear normal.     Left Ear: External ear normal.  Eyes:     General: No scleral icterus.       Right eye: No discharge.        Left eye: No discharge.     Conjunctiva/sclera: Conjunctivae normal.  Cardiovascular:     Rate and Rhythm: Normal rate and regular rhythm.  Pulmonary:     Effort: Pulmonary effort is normal. No respiratory distress.     Breath sounds: Normal breath sounds. No stridor. No wheezing or rhonchi.  Abdominal:     General: Bowel sounds are normal.  Musculoskeletal:     Cervical back: No rigidity or tenderness.     Right lower leg: No edema.     Left lower leg: No edema.  Lymphadenopathy:     Cervical: No cervical adenopathy.  Skin:    General: Skin is warm and dry.  Neurological:     General: No focal deficit present.     Mental Status: He is alert and oriented to person, place, and time.    Psychiatric:        Mood and Affect: Mood normal.        Behavior: Behavior normal.     BP 126/66   Pulse 92   Temp 98 F (36.7 C) (Tympanic)   Ht 5\' 10"  (1.778 m)  Wt 238 lb 9.6 oz (108.2 kg)   SpO2 97%   BMI 34.24 kg/m  Wt Readings from Last 3 Encounters:  04/19/20 238 lb 9.6 oz (108.2 kg)  12/03/19 239 lb (108.4 kg)  11/05/19 234 lb (106.1 kg)     Health Maintenance Due  Topic Date Due  . Hepatitis C Screening  Never done  . HIV Screening  Never done  . INFLUENZA VACCINE  03/12/2020    There are no preventive care reminders to display for this patient.  Lab Results  Component Value Date   TSH 0.94 10/18/2019   Lab Results  Component Value Date   WBC 8.6 10/18/2019   HGB 14.4 10/18/2019   HCT 42.4 10/18/2019   MCV 87.8 10/18/2019   PLT 297.0 10/18/2019   Lab Results  Component Value Date   NA 138 10/18/2019   K 4.2 10/18/2019   CO2 27 10/18/2019   GLUCOSE 94 10/18/2019   BUN 13 10/18/2019   CREATININE 0.93 10/18/2019   BILITOT 0.5 10/18/2019   ALKPHOS 53 10/18/2019   AST 16 10/18/2019   ALT 16 10/18/2019   PROT 6.4 10/18/2019   ALBUMIN 3.9 10/18/2019   CALCIUM 8.9 10/18/2019   GFR 83.05 10/18/2019   Lab Results  Component Value Date   CHOL 200 10/18/2019   Lab Results  Component Value Date   HDL 39.40 10/18/2019   Lab Results  Component Value Date   LDLCALC 111 (H) 10/19/2018   Lab Results  Component Value Date   TRIG 217.0 (H) 10/18/2019   Lab Results  Component Value Date   CHOLHDL 5 10/18/2019   No results found for: HGBA1C    Assessment & Plan:   Problem List Items Addressed This Visit      Cardiovascular and Mediastinum   Essential hypertension - Primary   Relevant Medications   atorvastatin (LIPITOR) 20 MG tablet   Other Relevant Orders   Basic metabolic panel     Other   SOB (shortness of breath)   Relevant Orders   DG Chest 2 View   Ambulatory referral to Pulmonology   Elevated LDL cholesterol level    Relevant Medications   atorvastatin (LIPITOR) 20 MG tablet   Other Relevant Orders   LDL cholesterol, direct      Meds ordered this encounter  Medications  . atorvastatin (LIPITOR) 20 MG tablet    Sig: Take 1 tablet (20 mg total) by mouth daily.    Dispense:  90 tablet    Refill:  3    Follow-up: Return in about 3 months (around 07/19/2020).  Continue lisinopril and start atorvastatin.  Given information on counting calories to lose weight.  Checking chest x-ray today.  Pulmonary consultation.  Follow-up in 3 months.  Libby Maw, MD

## 2020-04-19 NOTE — Patient Instructions (Signed)

## 2020-06-05 ENCOUNTER — Encounter: Payer: Self-pay | Admitting: Family

## 2020-06-05 ENCOUNTER — Ambulatory Visit: Payer: BC Managed Care – PPO | Admitting: Family

## 2020-06-05 ENCOUNTER — Other Ambulatory Visit: Payer: Self-pay

## 2020-06-05 VITALS — BP 140/84 | HR 80 | Temp 97.8°F | Ht 70.0 in | Wt 244.4 lb

## 2020-06-05 DIAGNOSIS — T783XXA Angioneurotic edema, initial encounter: Secondary | ICD-10-CM | POA: Diagnosis not present

## 2020-06-05 MED ORDER — LOSARTAN POTASSIUM 50 MG PO TABS: 50.0000 mg | ORAL_TABLET | Freq: Every day | ORAL | 3 refills | Status: DC

## 2020-06-05 NOTE — Progress Notes (Signed)
Acute Office Visit  Subjective:    Patient ID: Christopher Blackburn, male    DOB: 1961-06-23, 59 y.o.   MRN: [id number]  Chief Complaint  Patient presents with  . Acute Visit    Pt states he noticed his lip swelling on last Thursday and he took some Bendryl and it went down. Pt states he this morning he noticed his lip swelling again on the opposite side (right).     HPI Patient is in today with c/o upper lip swelling that originally occurred last week, he took Benadryl that helped. Then he took Lisinopril last night and the swelling returned. He saw the nurse at work who suggested he come in to be seen.   Past Medical History:  Diagnosis Date  . GERD (gastroesophageal reflux disease)   . Hypertension     Past Surgical History:  Procedure Laterality Date  . SHOULDER SURGERY      History reviewed. No pertinent family history.  Social History   Socioeconomic History  . Marital status: Married    Spouse name: Not on file  . Number of children: Not on file  . Years of education: Not on file  . Highest education level: Not on file  Occupational History  . Not on file  Tobacco Use  . Smoking status: Former Smoker    Quit date: 09/23/1992    Years since quitting: 27.7  . Smokeless tobacco: Never Used  Substance and Sexual Activity  . Alcohol use: Yes    Comment: socially and no more than 2 in a given setting.   . Drug use: Never  . Sexual activity: Not on file  Other Topics Concern  . Not on file  Social History Narrative  . Not on file   Social Determinants of Health   Financial Resource Strain:   . Difficulty of Paying Living Expenses: Not on file  Food Insecurity:   . Worried About Charity fundraiser in the Last Year: Not on file  . Ran Out of Food in the Last Year: Not on file  Transportation Needs:   . Lack of Transportation (Medical): Not on file  . Lack of Transportation (Non-Medical): Not on file  Physical Activity:   . Days of Exercise per Week: Not  on file  . Minutes of Exercise per Session: Not on file  Stress:   . Feeling of Stress : Not on file  Social Connections:   . Frequency of Communication with Friends and Family: Not on file  . Frequency of Social Gatherings with Friends and Family: Not on file  . Attends Religious Services: Not on file  . Active Member of Clubs or Organizations: Not on file  . Attends Archivist Meetings: Not on file  . Marital Status: Not on file  Intimate Partner Violence:   . Fear of Current or Ex-Partner: Not on file  . Emotionally Abused: Not on file  . Physically Abused: Not on file  . Sexually Abused: Not on file    Outpatient Medications Prior to Visit  Medication Sig Dispense Refill  . aspirin EC 81 MG tablet Take 1 tablet (81 mg total) by mouth daily. 90 tablet 3  . atorvastatin (LIPITOR) 20 MG tablet Take 1 tablet (20 mg total) by mouth daily. 90 tablet 3  . ranolazine (RANEXA) 500 MG 12 hr tablet Take 1 tablet (500 mg total) by mouth 2 (two) times daily. 90 tablet 3  . sildenafil (REVATIO) 20 MG tablet TAKE THREE TO  FIVE TABLETS BY MOUTH DAILY FOURTY-FIVE MINUTES PRIOR TO ACTIVITY AS NEEDED 50 tablet 2  . traZODone (DESYREL) 50 MG tablet TAKE ONE-HALF (1/2) TO ONE TABLET AT BEDTIME AS NEEDED FOR SLEEP 90 tablet 3  . lisinopril (ZESTRIL) 20 MG tablet Take 1 tablet (20 mg total) by mouth daily. 90 tablet 2  . nitroGLYCERIN (NITROSTAT) 0.4 MG SL tablet Place 1 tablet (0.4 mg total) under the tongue every 5 (five) minutes as needed. 25 tablet 3   No facility-administered medications prior to visit.    Allergies  Allergen Reactions  . Lisinopril Swelling    Angioedema    Review of Systems  HENT:       Upper lip swelling   Respiratory: Negative.   Cardiovascular: Negative.   Musculoskeletal: Negative.   Allergic/Immunologic: Negative.   Psychiatric/Behavioral: Negative.   All other systems reviewed and are negative.      Objective:    Physical Exam Constitutional:        Appearance: Normal appearance.  HENT:     Nose: Nose normal.     Mouth/Throat:     Comments: Upper lip swelling Cardiovascular:     Rate and Rhythm: Normal rate and regular rhythm.  Pulmonary:     Effort: Pulmonary effort is normal.     Breath sounds: Normal breath sounds.  Abdominal:     General: Abdomen is flat.  Musculoskeletal:        General: Normal range of motion.     Cervical back: Normal range of motion and neck supple.  Skin:    General: Skin is warm and dry.  Neurological:     Mental Status: He is alert.  Psychiatric:        Mood and Affect: Mood normal.     BP 140/84 (BP Location: Left Arm, Patient Position: Sitting, Cuff Size: Large)   Pulse 80   Temp 97.8 F (36.6 C) (Temporal)   Ht 5\' 10"  (1.778 m)   Wt 244 lb 6.4 oz (110.9 kg)   SpO2 97%   BMI 35.07 kg/m  Wt Readings from Last 3 Encounters:  06/05/20 244 lb 6.4 oz (110.9 kg)  04/19/20 238 lb 9.6 oz (108.2 kg)  12/03/19 239 lb (108.4 kg)    Health Maintenance Due  Topic Date Due  . Hepatitis C Screening  Never done  . COVID-19 Vaccine (1) Never done  . HIV Screening  Never done  . INFLUENZA VACCINE  03/12/2020    There are no preventive care reminders to display for this patient.   Lab Results  Component Value Date   TSH 0.94 10/18/2019   Lab Results  Component Value Date   WBC 8.6 10/18/2019   HGB 14.4 10/18/2019   HCT 42.4 10/18/2019   MCV 87.8 10/18/2019   PLT 297.0 10/18/2019   Lab Results  Component Value Date   NA 137 04/19/2020   K 4.2 04/19/2020   CO2 26 04/19/2020   GLUCOSE 95 04/19/2020   BUN 13 04/19/2020   CREATININE 0.89 04/19/2020   BILITOT 0.5 10/18/2019   ALKPHOS 53 10/18/2019   AST 16 10/18/2019   ALT 16 10/18/2019   PROT 6.4 10/18/2019   ALBUMIN 3.9 10/18/2019   CALCIUM 8.8 04/19/2020   GFR 87.22 04/19/2020   Lab Results  Component Value Date   CHOL 200 10/18/2019   Lab Results  Component Value Date   HDL 39.40 10/18/2019   Lab Results   Component Value Date   LDLCALC 111 (H) 10/19/2018  Lab Results  Component Value Date   TRIG 217.0 (H) 10/18/2019   Lab Results  Component Value Date   CHOLHDL 5 10/18/2019   No results found for: HGBA1C     Assessment & Plan:  Christopher Blackburn was seen today for acute visit.  Diagnoses and all orders for this visit:  Angioedema, initial encounter  Other orders -     losartan (COZAAR) 50 MG tablet; Take 1 tablet (50 mg total) by mouth daily.    Meds ordered this encounter  Medications  . losartan (COZAAR) 50 MG tablet    Sig: Take 1 tablet (50 mg total) by mouth daily.    Dispense:  30 tablet    Refill:  3   Recheck blood pressure in 2 weeks  Kennyth Arnold, FNP

## 2020-06-05 NOTE — Patient Instructions (Signed)
Angioedema  Angioedema is sudden swelling in the body. The swelling can happen in any part of the body. It often happens on the skin and causes itchy, bumpy patches (hives) to form. This condition may:  Happen only one time.  Happen more than one time. It may come back at random times.  Keep coming back for a number of years. Someday it may stop coming back. Follow these instructions at home:  Take over-the-counter and prescription medicines only as told by your doctor.  If you were given medicines for emergency allergy treatment, always carry them with you.  Wear a medical bracelet as told by your doctor.  Avoid the things that cause your attacks (triggers).  If this condition was passed to you from your parents and you want to have kids, talk to your doctor. Your kids may also have this condition. Contact a doctor if:  You have another attack.  Your attacks happen more often, even after you take steps to prevent them.  This condition was passed to you by your parents and you want to have kids. Get help right away if:  Your mouth, tongue, or lips get very swollen.  You have trouble breathing.  You have trouble swallowing.  You pass out (faint). This information is not intended to replace advice given to you by your health care provider. Make sure you discuss any questions you have with your health care provider. Document Revised: 07/11/2017 Document Reviewed: 02/06/2016 Elsevier Patient Education  2020 [employer]  

## 2020-06-07 ENCOUNTER — Encounter: Payer: Self-pay | Admitting: Family Medicine

## 2020-06-08 ENCOUNTER — Other Ambulatory Visit: Payer: Self-pay

## 2020-06-08 DIAGNOSIS — I10 Essential (primary) hypertension: Secondary | ICD-10-CM

## 2020-06-08 MED ORDER — LOSARTAN POTASSIUM 50 MG PO TABS: 50.0000 mg | ORAL_TABLET | Freq: Every day | ORAL | 3 refills | Status: DC

## 2020-06-12 ENCOUNTER — Other Ambulatory Visit: Payer: Self-pay

## 2020-06-12 ENCOUNTER — Encounter: Payer: Self-pay | Admitting: Emergency Medicine

## 2020-06-12 ENCOUNTER — Ambulatory Visit: Payer: BC Managed Care – PPO | Admitting: Emergency Medicine

## 2020-06-12 VITALS — BP 144/86 | HR 90 | Temp 98.0°F | Ht 70.0 in | Wt 243.0 lb

## 2020-06-12 DIAGNOSIS — R0602 Shortness of breath: Secondary | ICD-10-CM

## 2020-06-12 NOTE — Progress Notes (Signed)
Subjective:    Patient ID: Christopher Blackburn, male    DOB: 1960/08/30, 59 y.o.   MRN: [id number]  HPI 59 year old former smoker (15 pack years) with history of hypertension, GERD, hyperlipidemia. Also with a history of lip swelling related to ACE inhibitor exposure. No hx asthma. He is referred today for evaluation of shortness of breath.  He describes exercise associated shortness of breath and chest discomfort. This initially happened when walking uphill, about mid-2020. Started to happen with shorter distances, no incline. He is able to walk on flat ground. It is especially bothersome when he is overheated, in the hot weather. Can be associated with a L to mid chest tightness that resolves with rest. No cough. He can hear noise with breathing, especially at night and w exhalation. Minimal PND. He has GERD that is controlled on omeprazole. He has gained about 30 lbs in the last 18 months.   Gated cardiac stress scan performed 11/05/2019 showed normal ejection fraction greater than 65%, no ST changes, normal nuclear study. Chest x-ray 04/19/2020 reviewed by me, shows some mild pleural-parenchymal bilateral scar, elevation of the right hemidiaphragm    Review of Systems As per HPI  Past Medical History:  Diagnosis Date  . GERD (gastroesophageal reflux disease)   . Hypertension      No family history on file.   Social History   Socioeconomic History  . Marital status: Married    Spouse name: Not on file  . Number of children: Not on file  . Years of education: Not on file  . Highest education level: Not on file  Occupational History  . Not on file  Tobacco Use  . Smoking status: Former Smoker    Packs/day: 1.00    Years: 15.00    Pack years: 15.00    Quit date: 09/23/1992    Years since quitting: 27.7  . Smokeless tobacco: Never Used  Vaping Use  . Vaping Use: Never used  Substance and Sexual Activity  . Alcohol use: Yes    Comment: socially and no more than 2 in a  given setting.   . Drug use: Never  . Sexual activity: Not on file  Other Topics Concern  . Not on file  Social History Narrative  . Not on file   Social Determinants of Health   Financial Resource Strain:   . Difficulty of Paying Living Expenses: Not on file  Food Insecurity:   . Worried About Charity fundraiser in the Last Year: Not on file  . Ran Out of Food in the Last Year: Not on file  Transportation Needs:   . Lack of Transportation (Medical): Not on file  . Lack of Transportation (Non-Medical): Not on file  Physical Activity:   . Days of Exercise per Week: Not on file  . Minutes of Exercise per Session: Not on file  Stress:   . Feeling of Stress : Not on file  Social Connections:   . Frequency of Communication with Friends and Family: Not on file  . Frequency of Social Gatherings with Friends and Family: Not on file  . Attends Religious Services: Not on file  . Active Member of Clubs or Organizations: Not on file  . Attends Archivist Meetings: Not on file  . Marital Status: Not on file  Intimate Partner Violence:   . Fear of Current or Ex-Partner: Not on file  . Emotionally Abused: Not on file  . Physically Abused: Not on file  .  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 Lisinopril Swelling    Angioedema     Outpatient Medications Prior to Visit  Medication Sig Dispense Refill  . aspirin EC 81 MG tablet Take 1 tablet (81 mg total) by mouth daily. 90 tablet 3  . atorvastatin (LIPITOR) 20 MG tablet Take 1 tablet (20 mg total) by mouth daily. 90 tablet 3  . losartan (COZAAR) 50 MG tablet Take 1 tablet (50 mg total) by mouth daily. 30 tablet 3  . sildenafil (REVATIO) 20 MG tablet TAKE THREE TO FIVE TABLETS BY MOUTH DAILY FOURTY-FIVE MINUTES PRIOR TO ACTIVITY AS NEEDED 50 tablet 2  . traZODone  (DESYREL) 50 MG tablet TAKE ONE-HALF (1/2) TO ONE TABLET AT BEDTIME AS NEEDED FOR SLEEP 90 tablet 3  . nitroGLYCERIN (NITROSTAT) 0.4 MG SL tablet Place 1 tablet (0.4 mg total) under the tongue every 5 (five) minutes as needed. 25 tablet 3  . ranolazine (RANEXA) 500 MG 12 hr tablet Take 1 tablet (500 mg total) by mouth 2 (two) times daily. 90 tablet 3   No facility-administered medications prior to visit.        Objective:   Physical Exam Vitals:   06/12/20 1528  BP: (!) 144/86  Pulse: 90  Temp: 98 F (36.7 C)  TempSrc: Temporal  SpO2: 98%  Weight: 243 lb (110.2 kg)  Height: 5\' 10"  (1.778 m)   Gen: Pleasant, well-nourished, in no distress,  normal affect  ENT: No lesions,  mouth clear,  oropharynx clear, no postnasal drip  Neck: No JVD, no stridor  Lungs: No use of accessory muscles, no crackles or wheezing on normal respiration, no wheeze on forced expiration  Cardiovascular: RRR, heart sounds normal, no murmur or gallops, no peripheral edema  Musculoskeletal: His L thigh is larger, more muscular than his R.   Neuro: alert, awake, non focal  Skin: Warm, no lesions or rash      Assessment & Plan:  SOB (shortness of breath) Dyspnea associated with chest tightness with reassuring cardiac evaluation.  Question component of obstructive lung disease although he does get relief with rest, does not have continued chest tightness, wheezing etc.  He does have a history of tobacco use, 15 pack years.  Plan for pulmonary function testing to evaluate for any evidence of restriction or obstruction.  His chest x-ray shows elevated right hemidiaphragm, some possible pleural-parenchymal scar.  We will check a sniff test to ensure no evidence for hemidiaphragmatic paralysis.  Depending on his PFT he may merit a CT chest to look for interstitial changes.  Consider deconditioning, weight gain.  He has gained 30 pounds in the last 18 months.  Baltazar Apo, MD, PhD 06/12/2020, 4:22 PM [facility] [phone] or if no answer [phone]

## 2020-06-12 NOTE — Assessment & Plan Note (Signed)
Dyspnea associated with chest tightness with reassuring cardiac evaluation.  Question component of obstructive lung disease although he does get relief with rest, does not have continued chest tightness, wheezing etc.  He does have a history of tobacco use, 15 pack years.  Plan for pulmonary function testing to evaluate for any evidence of restriction or obstruction.  His chest x-ray shows elevated right hemidiaphragm, some possible pleural-parenchymal scar.  We will check a sniff test to ensure no evidence for hemidiaphragmatic paralysis.  Depending on his PFT he may merit a CT chest to look for interstitial changes.  Consider deconditioning, weight gain.  He has gained 30 pounds in the last 18 months.

## 2020-06-12 NOTE — Patient Instructions (Signed)
We will arrange for pulmonary function testing in next office visit We will arrange for a sniff test Depending on your results we will decide whether any other evaluation is indicated Follow with Dr. Lamonte Sakai next available with full pulmonary function testing on the same day.

## 2020-06-13 ENCOUNTER — Telehealth: Payer: Self-pay | Admitting: Emergency Medicine

## 2020-06-13 NOTE — Telephone Encounter (Signed)
Wife missed a called she thought was from here however I do not see anywhere anyone has called her she is ok just checking to see Christopher Blackburn

## 2020-06-19 ENCOUNTER — Ambulatory Visit (HOSPITAL_COMMUNITY): Payer: BC Managed Care – PPO

## 2020-06-19 ENCOUNTER — Encounter (HOSPITAL_COMMUNITY): Payer: Self-pay

## 2020-06-20 ENCOUNTER — Ambulatory Visit: Payer: BC Managed Care – PPO | Admitting: Family

## 2020-06-21 ENCOUNTER — Encounter: Payer: Self-pay | Admitting: Nurse Practitioner

## 2020-06-21 DIAGNOSIS — T783XXD Angioneurotic edema, subsequent encounter: Secondary | ICD-10-CM

## 2020-06-21 DIAGNOSIS — I10 Essential (primary) hypertension: Secondary | ICD-10-CM

## 2020-06-22 MED ORDER — AMLODIPINE BESYLATE 5 MG PO TABS: 5.0000 mg | ORAL_TABLET | Freq: Every day | ORAL | 5 refills | Status: DC

## 2020-06-23 ENCOUNTER — Other Ambulatory Visit: Payer: Self-pay

## 2020-06-23 DIAGNOSIS — I10 Essential (primary) hypertension: Secondary | ICD-10-CM

## 2020-06-23 MED ORDER — AMLODIPINE BESYLATE 5 MG PO TABS: 5.0000 mg | ORAL_TABLET | Freq: Every day | ORAL | 5 refills | Status: DC

## 2020-06-23 NOTE — Telephone Encounter (Signed)
Pt called and stated that medication was sent to wrong pharmacy.

## 2020-06-30 DIAGNOSIS — R061 Stridor: Secondary | ICD-10-CM | POA: Diagnosis not present

## 2020-06-30 DIAGNOSIS — T783XXA Angioneurotic edema, initial encounter: Secondary | ICD-10-CM | POA: Diagnosis not present

## 2020-07-10 DIAGNOSIS — T783XXA Angioneurotic edema, initial encounter: Secondary | ICD-10-CM | POA: Diagnosis not present

## 2020-07-10 DIAGNOSIS — J302 Other seasonal allergic rhinitis: Secondary | ICD-10-CM | POA: Diagnosis not present

## 2020-07-24 ENCOUNTER — Other Ambulatory Visit (HOSPITAL_COMMUNITY): Payer: BC Managed Care – PPO

## 2020-07-27 ENCOUNTER — Ambulatory Visit: Payer: BC Managed Care – PPO | Admitting: Emergency Medicine

## 2020-10-18 ENCOUNTER — Other Ambulatory Visit: Payer: Self-pay

## 2020-10-19 ENCOUNTER — Encounter: Payer: Self-pay | Admitting: Adult Health

## 2020-10-19 ENCOUNTER — Ambulatory Visit: Payer: BC Managed Care – PPO | Admitting: Adult Health

## 2020-10-19 VITALS — BP 132/76 | HR 108 | Temp 98.5°F | Resp 16 | Ht 70.0 in | Wt 245.0 lb

## 2020-10-19 DIAGNOSIS — Z0000 Encounter for general adult medical examination without abnormal findings: Secondary | ICD-10-CM

## 2020-10-19 DIAGNOSIS — I10 Essential (primary) hypertension: Secondary | ICD-10-CM

## 2020-10-19 DIAGNOSIS — R0789 Other chest pain: Secondary | ICD-10-CM

## 2020-10-19 DIAGNOSIS — R0602 Shortness of breath: Secondary | ICD-10-CM | POA: Diagnosis not present

## 2020-10-19 DIAGNOSIS — N5201 Erectile dysfunction due to arterial insufficiency: Secondary | ICD-10-CM

## 2020-10-19 DIAGNOSIS — E668 Other obesity: Secondary | ICD-10-CM

## 2020-10-19 DIAGNOSIS — G4700 Insomnia, unspecified: Secondary | ICD-10-CM | POA: Diagnosis not present

## 2020-10-19 DIAGNOSIS — Z1211 Encounter for screening for malignant neoplasm of colon: Secondary | ICD-10-CM

## 2020-10-19 MED ORDER — SILDENAFIL CITRATE 20 MG PO TABS
ORAL_TABLET | ORAL | 3 refills | Status: DC
Start: 2020-10-19 — End: 2021-02-21

## 2020-10-19 MED ORDER — AMLODIPINE BESYLATE 5 MG PO TABS: 5.0000 mg | ORAL_TABLET | Freq: Every day | ORAL | 1 refills | Status: DC

## 2020-10-19 NOTE — Patient Instructions (Signed)
It was great seeing you today   Someone will call you to schedule your colonoscopy   Please schedule your physical exam in the next 1-3 months   Let me know if you need anything   It is ok to increase Trazodone to 100 mg nightly

## 2020-10-19 NOTE — Progress Notes (Signed)
Patient presents to clinic today to establish care. He is a pleasant 60 year old male who  has a past medical history of GERD (gastroesophageal reflux disease) and Hypertension.   Acute Concerns: Establish Care   Chronic Issues: HTN - Currently prescribed Norvasc 5 mg daily. In the past had angioedema with lisinopril. He denies dizziness, headaches, or blurred vision.   BP Readings from Last 3 Encounters:  10/19/20 132/76  06/12/20 (!) 144/86  06/05/20 140/84   Hyperlipidemia - takes Lipitor 20 mg. Denies myalgia or fatigue  Lab Results  Component Value Date   CHOL 200 10/18/2019   HDL 39.40 10/18/2019   LDLCALC 111 (H) 10/19/2018   LDLDIRECT 136.0 04/19/2020   TRIG 217.0 (H) 10/18/2019   CHOLHDL 5 10/18/2019   Sleep disturbance -reports that he is currently taking trazodone 50 mg nightly.  Has no trouble falling asleep but he does wake up every morning at 2 AM and has a trouble falling back asleep.  SOB and CP -had a gated cardiac stress test performed in March 2021 which showed normal EF greater than 65%, no ST changes, normal nuclear study.  Symptoms started in mid 2020 at which time he was sent to see a cardiologist.  Most recently was seen by pulmonary in November 2021 at which time PFTs were ordered.  Patient reports that he was sent a letter to go to [facility] and when he went got to [place] long he was told that he was supposed to be at [facility].  He has not rescheduled the last seen yet.  Continues to have some exercise associated shortness of breath and left-sided chest discomfort.  Happens more when walking on hills and during hot weather.  Symptoms resolved at rest or with deep breathing exercises.   Health Maintenance: Dental -- Routine Care  Vision -- Routine Care - [facility]  Immunizations -- UTD  Colonoscopy --  Has colonoscopy 12 years ago. - History of polyps.     Past Medical History:  Diagnosis Date  . GERD (gastroesophageal reflux  disease)   . Hypertension     Past Surgical History:  Procedure Laterality Date  . ANKLE SURGERY Right 2015  . INGUINAL HERNIA REPAIR Right 1995  . SHOULDER SURGERY      Current Outpatient Medications on File Prior to Visit  Medication Sig Dispense Refill  . amLODipine (NORVASC) 5 MG tablet Take 1 tablet (5 mg total) by mouth daily. 30 tablet 5  . aspirin EC 81 MG tablet Take 1 tablet (81 mg total) by mouth daily. 90 tablet 3  . atorvastatin (LIPITOR) 20 MG tablet Take 1 tablet (20 mg total) by mouth daily. 90 tablet 3  . cetirizine (ZYRTEC) 10 MG tablet Take 10 mg by mouth daily.    . sildenafil (REVATIO) 20 MG tablet TAKE THREE TO FIVE TABLETS BY MOUTH DAILY FOURTY-FIVE MINUTES PRIOR TO ACTIVITY AS NEEDED 50 tablet 2  . traZODone (DESYREL) 50 MG tablet TAKE ONE-HALF (1/2) TO ONE TABLET AT BEDTIME AS NEEDED FOR SLEEP 90 tablet 3  . nitroGLYCERIN (NITROSTAT) 0.4 MG SL tablet Place 1 tablet (0.4 mg total) under the tongue every 5 (five) minutes as needed. 25 tablet 3   No current facility-administered medications on file prior to visit.    Allergies  Allergen Reactions  . Lisinopril Swelling    Angioedema  . Losartan Anaphylaxis and Swelling    Lip swelling    Family History  Problem Relation Age of  Onset  . Breast cancer Maternal Grandmother   . Colon cancer Maternal Grandmother   . Heart disease Maternal Grandfather   . Stroke Maternal Grandfather     Social History   Socioeconomic History  . Marital status: Married    Spouse name: Not on file  . Number of children: 2  . Years of education: Not on file  . Highest education level: Not on file  Occupational History  . Occupation: Company secretary    Comment: building school buses  Tobacco Use  . Smoking status: Former Smoker    Packs/day: 1.00    Years: 15.00    Pack years: 15.00    Quit date: 09/23/1992    Years since quitting: 28.0  . Smokeless tobacco: Never Used  Vaping Use  . Vaping Use: Never used   Substance and Sexual Activity  . Alcohol use: Yes    Alcohol/week: 2.0 standard drinks    Types: 2 Shots of liquor per week    Comment: daily  . Drug use: Never  . Sexual activity: Yes    Partners: Female  Other Topics Concern  . Not on file  Social History Narrative  . Not on file   Social Determinants of Health   Financial Resource Strain: Not on file  Food Insecurity: Not on file  Transportation Needs: Not on file  Physical Activity: Inactive  . Days of Exercise per Week: 0 days  . Minutes of Exercise per Session: 0 min  Stress: Not on file  Social Connections: Not on file  Intimate Partner Violence: Not on file    Review of Systems  Constitutional: Negative.   HENT: Negative.   Respiratory: Positive for shortness of breath.   Cardiovascular: Positive for chest pain.  Gastrointestinal: Negative.   Genitourinary: Negative.   Musculoskeletal: Negative.   Skin: Negative.   Endo/Heme/Allergies: Negative.   Psychiatric/Behavioral: The patient has insomnia.   All other systems reviewed and are negative.   BP 132/76 (BP Location: Left Arm, Patient Position: Sitting, Cuff Size: Large)   Pulse (!) 108   Temp 98.5 F (36.9 C) (Oral)   Resp 16   Ht 5\' 10"  (1.778 m)   Wt 245 lb (111.1 kg)   SpO2 99%   BMI 35.15 kg/m   Physical Exam Vitals and nursing note reviewed.  Constitutional:      Appearance: Normal appearance. He is obese.  Cardiovascular:     Rate and Rhythm: Normal rate and regular rhythm.     Pulses: Normal pulses.     Heart sounds: Normal heart sounds.  Pulmonary:     Effort: Pulmonary effort is normal.     Breath sounds: Normal breath sounds.  Musculoskeletal:        General: Normal range of motion.  Skin:    General: Skin is warm and dry.  Neurological:     General: No focal deficit present.     Mental Status: He is alert and oriented to person, place, and time.  Psychiatric:        Mood and Affect: Mood normal.        Behavior: Behavior  normal.        Thought Content: Thought content normal.        Judgment: Judgment normal.     Assessment/Plan: 1. Encounter for medical examination to establish care - Follow up for CPE  - Continue to exercise and work on diet   2. Erectile dysfunction due to arterial insufficiency  - sildenafil (REVATIO)  20 MG tablet; TAKE THREE TO FIVE TABLETS BY MOUTH DAILY FOURTY-FIVE MINUTES PRIOR TO ACTIVITY AS NEEDED  Dispense: 50 tablet; Refill: 3  3. Essential hypertension - No change in medications  - amLODipine (NORVASC) 5 MG tablet; Take 1 tablet (5 mg total) by mouth daily.  Dispense: 90 tablet; Refill: 1  4. SOB (shortness of breath) - Encouraged PFT and is that I would reorder testing.  He would like to hold off at this time  5. Insomnia, unspecified type - Ok to increase Trazodone to 100 mg QHS  6. Discomfort in chest - Encouraged PFTS  7. Other obesity - Work on weight loss through lifestyle modifications   8. Colon cancer screening  - Ambulatory referral to Gastroenterology

## 2020-10-23 DIAGNOSIS — D2372 Other benign neoplasm of skin of left lower limb, including hip: Secondary | ICD-10-CM | POA: Diagnosis not present

## 2020-10-23 DIAGNOSIS — L820 Inflamed seborrheic keratosis: Secondary | ICD-10-CM | POA: Diagnosis not present

## 2020-10-23 DIAGNOSIS — L738 Other specified follicular disorders: Secondary | ICD-10-CM | POA: Diagnosis not present

## 2020-10-23 DIAGNOSIS — L821 Other seborrheic keratosis: Secondary | ICD-10-CM | POA: Diagnosis not present

## 2020-10-23 DIAGNOSIS — D485 Neoplasm of uncertain behavior of skin: Secondary | ICD-10-CM | POA: Diagnosis not present

## 2020-11-30 ENCOUNTER — Ambulatory Visit (INDEPENDENT_AMBULATORY_CARE_PROVIDER_SITE_OTHER): Payer: BC Managed Care – PPO | Admitting: Adult Health

## 2020-11-30 ENCOUNTER — Encounter: Payer: Self-pay | Admitting: Adult Health

## 2020-11-30 ENCOUNTER — Other Ambulatory Visit: Payer: Self-pay

## 2020-11-30 VITALS — BP 138/80 | HR 88 | Temp 98.4°F | Ht 70.25 in | Wt 242.8 lb

## 2020-11-30 DIAGNOSIS — I10 Essential (primary) hypertension: Secondary | ICD-10-CM | POA: Diagnosis not present

## 2020-11-30 DIAGNOSIS — G4700 Insomnia, unspecified: Secondary | ICD-10-CM

## 2020-11-30 DIAGNOSIS — Z1211 Encounter for screening for malignant neoplasm of colon: Secondary | ICD-10-CM

## 2020-11-30 DIAGNOSIS — Z125 Encounter for screening for malignant neoplasm of prostate: Secondary | ICD-10-CM | POA: Diagnosis not present

## 2020-11-30 DIAGNOSIS — Z0000 Encounter for general adult medical examination without abnormal findings: Secondary | ICD-10-CM

## 2020-11-30 DIAGNOSIS — E785 Hyperlipidemia, unspecified: Secondary | ICD-10-CM

## 2020-11-30 DIAGNOSIS — Z23 Encounter for immunization: Secondary | ICD-10-CM

## 2020-11-30 DIAGNOSIS — E668 Other obesity: Secondary | ICD-10-CM | POA: Diagnosis not present

## 2020-11-30 LAB — COMPREHENSIVE METABOLIC PANEL
ALT: 27 U/L (ref 0–53)
AST: 24 U/L (ref 0–37)
Albumin: 4 g/dL (ref 3.5–5.2)
Alkaline Phosphatase: 85 U/L (ref 39–117)
BUN: 12 mg/dL (ref 6–23)
CO2: 26 mEq/L (ref 19–32)
Calcium: 9.3 mg/dL (ref 8.4–10.5)
Chloride: 101 mEq/L (ref 96–112)
Creatinine, Ser: 0.94 mg/dL (ref 0.40–1.50)
GFR: 88.1 mL/min (ref >60.00)
Glucose, Bld: 99 mg/dL (ref 70–99)
Potassium: 4.2 mEq/L (ref 3.5–5.1)
Sodium: 137 mEq/L (ref 135–145)
Total Bilirubin: 1 mg/dL (ref 0.2–1.2)
Total Protein: 6.9 g/dL (ref 6.0–8.3)

## 2020-11-30 LAB — LIPID PANEL
Cholesterol: 136 mg/dL (ref 0–200)
HDL: 44.6 mg/dL (ref >39.00)
LDL Cholesterol: 58 mg/dL (ref 0–99)
NonHDL: 91.21
Total CHOL/HDL Ratio: 3
Triglycerides: 168 mg/dL — ABNORMAL HIGH (ref 0.0–149.0)
VLDL: 33.6 mg/dL (ref 0.0–40.0)

## 2020-11-30 LAB — CBC WITH DIFFERENTIAL/PLATELET
Basophils Absolute: 0.1 10*3/uL (ref 0.0–0.1)
Basophils Relative: 0.7 % (ref 0.0–3.0)
Eosinophils Absolute: 0.2 10*3/uL (ref 0.0–0.7)
Eosinophils Relative: 2.4 % (ref 0.0–5.0)
HCT: 44.5 % (ref 39.0–52.0)
Hemoglobin: 15.3 g/dL (ref 13.0–17.0)
Lymphocytes Relative: 28.2 % (ref 12.0–46.0)
Lymphs Abs: 2.3 10*3/uL (ref 0.7–4.0)
MCHC: 34.3 g/dL (ref 30.0–36.0)
MCV: 85.3 fl (ref 78.0–100.0)
Monocytes Absolute: 0.5 10*3/uL (ref 0.1–1.0)
Monocytes Relative: 6.8 % (ref 3.0–12.0)
Neutro Abs: 5 10*3/uL (ref 1.4–7.7)
Neutrophils Relative %: 61.9 % (ref 43.0–77.0)
Platelets: 309 10*3/uL (ref 150.0–400.0)
RBC: 5.21 Mil/uL (ref 4.22–5.81)
RDW: 13.6 % (ref 11.5–15.5)
WBC: 8.1 10*3/uL (ref 4.0–10.5)

## 2020-11-30 LAB — HEMOGLOBIN A1C: Hgb A1c MFr Bld: 5.2 % (ref 4.6–6.5)

## 2020-11-30 LAB — TSH: TSH: 1.07 u[IU]/mL (ref 0.35–4.50)

## 2020-11-30 LAB — PSA: PSA: 0.39 ng/mL (ref 0.10–4.00)

## 2020-11-30 NOTE — Patient Instructions (Signed)
It was great seeing you today   Please increase Norvasc to 10 mg daily. Let me know if this does not even out your blood pressure  Follow up in 6 weeks for second shingles vaccination - schedule this appointment at the front desk   I will see you back in a year or sooner if needed

## 2020-11-30 NOTE — Progress Notes (Signed)
Subjective:    Patient ID: Christopher Blackburn, male    DOB: [birth year], 60 y.o.   MRN: [id number]  HPI Patient presents for yearly preventative medicine examination. He is a pleasant 60 year old male who  has a past medical history of GERD (gastroesophageal reflux disease), Hyperlipidemia, and Hypertension.   HTN -currently prescribed Norvasc 5 mg daily.  He has had history of angioedema past with lisinopril.  Denies dizziness, headaches, or blurred vision. He has been checking his BP at home and reports readings between 130-150's  BP Readings from Last 3 Encounters:  11/30/20 138/80  10/19/20 132/76  06/12/20 (!) 144/86   Hyperlipidemia -currently prescribed Lipitor 20 mg daily.  He denies myalgia or fatigue Lab Results  Component Value Date   CHOL 200 10/18/2019   HDL 39.40 10/18/2019   LDLCALC 111 (H) 10/19/2018   LDLDIRECT 136.0 04/19/2020   TRIG 217.0 (H) 10/18/2019   CHOLHDL 5 10/18/2019   Sleep Disturbance -during his establish care visit last month he reported that he was having trouble maintaining sleep with trazodone 50 mg, he was waking up most mornings around 2 AM and unable to go back to sleep.  At this time we increase his trazodone to 100 mg nightly.  With the increase he reports that some nights he is sleeping throughout the night and others he continues to still wake up around 2:58 AM and is unable to go back to sleep.  Most of his sleepless nights are during the week, sleeps much better on the weekends when he is not having to work.  SOB and CP -him started in mid 2020 at which time he was sent to see a cardiologist.  He had a gated cardiac stress test performed in March 2021 which showed normal EF greater than 65%, no ST changes, normal nuclear study.  He was seen by pulmonary in November 2021 at which time PFTs were ordered.  Was some miscommunication on where he was supposed to be for his PFTs and he had to cancel.  He has not rescheduled his PFTs yet. Reports  recently he has been symptoms free.   All immunizations and health maintenance protocols were reviewed with the patient and needed orders were placed.  Appropriate screening laboratory values were ordered for the patient including screening of hyperlipidemia, renal function and hepatic function. If indicated by BPH, a PSA was ordered.  Medication reconciliation,  past medical history, social history, problem list and allergies were reviewed in detail with the patient  Goals were established with regard to weight loss, exercise, and  diet in compliance with medications  Wt Readings from Last 3 Encounters:  11/30/20 242 lb 12.8 oz (110.1 kg)  10/19/20 245 lb (111.1 kg)  06/12/20 243 lb (110.2 kg)   He is due for colonoscopy, referral was placed last month and is still pending in que  Review of Systems  Constitutional: Negative.   HENT: Negative.   Eyes: Negative.   Respiratory: Negative.   Cardiovascular: Negative.   Gastrointestinal: Negative.   Endocrine: Negative.   Genitourinary: Negative.   Musculoskeletal: Negative.   Skin: Negative.   Allergic/Immunologic: Negative.   Neurological: Negative.   Hematological: Negative.   Psychiatric/Behavioral: Positive for sleep disturbance.  All other systems reviewed and are negative.    Past Medical History:  Diagnosis Date  . GERD (gastroesophageal reflux disease)   . Hyperlipidemia   . Hypertension     Social History   Socioeconomic History  . Marital status:  Married    Spouse name: Not on file  . Number of children: 2  . Years of education: Not on file  . Highest education level: Not on file  Occupational History  . Occupation: Company secretary    Comment: building school buses  Tobacco Use  . Smoking status: Former Smoker    Packs/day: 1.00    Years: 15.00    Pack years: 15.00    Quit date: 09/23/1992    Years since quitting: 28.2  . Smokeless tobacco: Never Used  Vaping Use  . Vaping Use: Never used  Substance  and Sexual Activity  . Alcohol use: Yes    Alcohol/week: 2.0 standard drinks    Types: 2 Shots of liquor per week    Comment: daily  . Drug use: Never  . Sexual activity: Yes    Partners: Female  Other Topics Concern  . Not on file  Social History Narrative  . Not on file   Social Determinants of Health   Financial Resource Strain: Not on file  Food Insecurity: Not on file  Transportation Needs: Not on file  Physical Activity: Inactive  . Days of Exercise per Week: 0 days  . Minutes of Exercise per Session: 0 min  Stress: Not on file  Social Connections: Not on file  Intimate Partner Violence: Not on file    Past Surgical History:  Procedure Laterality Date  . ANKLE SURGERY Right 2015  . INGUINAL HERNIA REPAIR Right 1995  . SHOULDER SURGERY      Family History  Problem Relation Age of Onset  . Breast cancer Maternal Grandmother   . Colon cancer Maternal Grandmother   . Heart disease Maternal Grandfather   . Stroke Maternal Grandfather     Allergies  Allergen Reactions  . Lisinopril Swelling    Angioedema  . Losartan Anaphylaxis and Swelling    Lip swelling    Current Outpatient Medications on File Prior to Visit  Medication Sig Dispense Refill  . amLODipine (NORVASC) 5 MG tablet Take 1 tablet (5 mg total) by mouth daily. (Patient taking differently: Take 10 mg by mouth daily.) 90 tablet 1  . aspirin EC 81 MG tablet Take 1 tablet (81 mg total) by mouth daily. 90 tablet 3  . atorvastatin (LIPITOR) 20 MG tablet Take 1 tablet (20 mg total) by mouth daily. 90 tablet 3  . cetirizine (ZYRTEC) 10 MG tablet Take 10 mg by mouth daily.    . sildenafil (REVATIO) 20 MG tablet TAKE THREE TO FIVE TABLETS BY MOUTH DAILY FOURTY-FIVE MINUTES PRIOR TO ACTIVITY AS NEEDED 50 tablet 3  . traZODone (DESYREL) 50 MG tablet TAKE ONE-HALF (1/2) TO ONE TABLET AT BEDTIME AS NEEDED FOR SLEEP 90 tablet 3  . nitroGLYCERIN (NITROSTAT) 0.4 MG SL tablet Place 1 tablet (0.4 mg total) under the  tongue every 5 (five) minutes as needed. 25 tablet 3   No current facility-administered medications on file prior to visit.    BP 138/80 (BP Location: Left Arm, Patient Position: Sitting, Cuff Size: Large)   Pulse 88   Temp 98.4 F (36.9 C) (Oral)   Ht 5' 10.25" (1.784 m)   Wt 242 lb 12.8 oz (110.1 kg)   SpO2 97%   BMI 34.59 kg/m       Objective:   Physical Exam Vitals and nursing note reviewed.  Constitutional:      General: He is not in acute distress.    Appearance: Normal appearance. He is well-developed. He is obese.  HENT:     Head: Normocephalic and atraumatic.     Right Ear: Tympanic membrane, ear canal and external ear normal. There is no impacted cerumen.     Left Ear: Tympanic membrane, ear canal and external ear normal. There is no impacted cerumen.     Nose: Nose normal. No congestion or rhinorrhea.     Mouth/Throat:     Mouth: Mucous membranes are moist.     Pharynx: Oropharynx is clear. No oropharyngeal exudate or posterior oropharyngeal erythema.  Eyes:     General:        Right eye: No discharge.        Left eye: No discharge.     Extraocular Movements: Extraocular movements intact.     Conjunctiva/sclera: Conjunctivae normal.     Pupils: Pupils are equal, round, and reactive to light.  Neck:     Vascular: No carotid bruit.     Trachea: No tracheal deviation.  Cardiovascular:     Rate and Rhythm: Normal rate and regular rhythm.     Pulses: Normal pulses.     Heart sounds: Normal heart sounds. No murmur heard. No friction rub. No gallop.   Pulmonary:     Effort: Pulmonary effort is normal. No respiratory distress.     Breath sounds: Normal breath sounds. No stridor. No wheezing, rhonchi or rales.  Chest:     Chest wall: No tenderness.  Abdominal:     General: Bowel sounds are normal. There is no distension.     Palpations: Abdomen is soft. There is no mass.     Tenderness: There is no abdominal tenderness. There is no right CVA tenderness, left  CVA tenderness, guarding or rebound.     Hernia: No hernia is present.  Musculoskeletal:        General: No swelling, tenderness, deformity or signs of injury. Normal range of motion.     Right lower leg: No edema.     Left lower leg: No edema.  Lymphadenopathy:     Cervical: No cervical adenopathy.  Skin:    General: Skin is warm and dry.     Capillary Refill: Capillary refill takes less than 2 seconds.     Coloration: Skin is not jaundiced or pale.     Findings: No bruising, erythema, lesion or rash.  Neurological:     General: No focal deficit present.     Mental Status: He is alert and oriented to person, place, and time.     Cranial Nerves: No cranial nerve deficit.     Sensory: No sensory deficit.     Motor: No weakness.     Coordination: Coordination normal.     Gait: Gait normal.     Deep Tendon Reflexes: Reflexes normal.  Psychiatric:        Mood and Affect: Mood normal.        Behavior: Behavior normal.        Thought Content: Thought content normal.        Judgment: Judgment normal.       Assessment & Plan:  1. Routine general medical examination at a health care facility  - CBC with Differential/Platelet; Future - Comprehensive metabolic panel; Future - Hemoglobin A1c; Future - Lipid panel; Future - TSH; Future - TSH - Lipid panel - Hemoglobin A1c - Comprehensive metabolic panel - CBC with Differential/Platelet  2. Essential hypertension - Will have him increase Norvasc to 10 mg. Follow up if BP not better controlled.  - CBC with  Differential/Platelet; Future - Comprehensive metabolic panel; Future - Hemoglobin A1c; Future - Lipid panel; Future - TSH; Future - TSH - Lipid panel - Hemoglobin A1c - Comprehensive metabolic panel - CBC with Differential/Platelet  3. Insomnia, unspecified type - Continue with Trazodone 100 mg QHS - CBC with Differential/Platelet; Future - Comprehensive metabolic panel; Future - Hemoglobin A1c; Future - Lipid panel;  Future - TSH; Future - TSH - Lipid panel - Hemoglobin A1c - Comprehensive metabolic panel - CBC with Differential/Platelet  4. Other obesity - Continue to work on weight loss through diet and exercise  - CBC with Differential/Platelet; Future - Comprehensive metabolic panel; Future - Hemoglobin A1c; Future - Lipid panel; Future - TSH; Future - TSH - Lipid panel - Hemoglobin A1c - Comprehensive metabolic panel - CBC with Differential/Platelet  5. Colon cancer screening - GI will notify   6. Dyslipidemia - Consider increase in statin  - CBC with Differential/Platelet; Future - Comprehensive metabolic panel; Future - Hemoglobin A1c; Future - Lipid panel; Future - TSH; Future - TSH - Lipid panel - Hemoglobin A1c - Comprehensive metabolic panel - CBC with Differential/Platelet  7. Prostate cancer screening  - PSA; Future - PSA  8. Need for shingles vaccine  - Varicella-zoster vaccine IM (Shingrix)  Dorothyann Peng, NP

## 2020-12-02 ENCOUNTER — Encounter: Payer: Self-pay | Admitting: Adult Health

## 2020-12-05 ENCOUNTER — Other Ambulatory Visit: Payer: Self-pay | Admitting: Adult Health

## 2020-12-05 DIAGNOSIS — G4700 Insomnia, unspecified: Secondary | ICD-10-CM

## 2020-12-05 MED ORDER — TRAZODONE HCL 100 MG PO TABS: ORAL_TABLET | ORAL | 1 refills | Status: DC

## 2020-12-18 ENCOUNTER — Encounter: Payer: Self-pay | Admitting: Gastroenterology

## 2021-01-01 ENCOUNTER — Telehealth: Payer: Self-pay | Admitting: Adult Health

## 2021-01-01 NOTE — Telephone Encounter (Signed)
Pharmacy is calling in stating that the pts spouse if calling in stating that the pt Rx amlodipine (NORVASC) 5 MG has been increased to 10 MG and they are needing a new prescription for their files.

## 2021-01-02 ENCOUNTER — Other Ambulatory Visit: Payer: Self-pay | Admitting: Adult Health

## 2021-01-02 ENCOUNTER — Encounter: Payer: Self-pay | Admitting: Adult Health

## 2021-01-02 DIAGNOSIS — I10 Essential (primary) hypertension: Secondary | ICD-10-CM

## 2021-01-02 MED ORDER — AMLODIPINE BESYLATE 10 MG PO TABS: 10.0000 mg | ORAL_TABLET | Freq: Every day | ORAL | 3 refills | Status: DC

## 2021-01-03 ENCOUNTER — Encounter: Payer: Self-pay | Admitting: Adult Health

## 2021-01-03 NOTE — Telephone Encounter (Signed)
This has been taking care of.

## 2021-01-04 ENCOUNTER — Other Ambulatory Visit: Payer: Self-pay | Admitting: Adult Health

## 2021-01-04 MED ORDER — IBUPROFEN 800 MG PO TABS: 800.0000 mg | ORAL_TABLET | Freq: Three times a day (TID) | ORAL | 2 refills | Status: DC | PRN

## 2021-01-30 ENCOUNTER — Other Ambulatory Visit: Payer: Self-pay

## 2021-01-30 ENCOUNTER — Ambulatory Visit (AMBULATORY_SURGERY_CENTER): Payer: BC Managed Care – PPO | Admitting: *Deleted

## 2021-01-30 VITALS — Ht 70.0 in | Wt 240.0 lb

## 2021-01-30 DIAGNOSIS — Z1211 Encounter for screening for malignant neoplasm of colon: Secondary | ICD-10-CM

## 2021-01-30 NOTE — Progress Notes (Signed)

## 2021-02-08 ENCOUNTER — Other Ambulatory Visit: Payer: Self-pay

## 2021-02-08 ENCOUNTER — Ambulatory Visit: Payer: BC Managed Care – PPO

## 2021-02-19 ENCOUNTER — Encounter: Payer: Self-pay | Admitting: Gastroenterology

## 2021-02-19 ENCOUNTER — Ambulatory Visit (AMBULATORY_SURGERY_CENTER): Payer: BC Managed Care – PPO | Admitting: Gastroenterology

## 2021-02-19 ENCOUNTER — Other Ambulatory Visit: Payer: Self-pay

## 2021-02-19 VITALS — BP 137/91 | HR 72 | Temp 98.1°F | Resp 16 | Ht 70.0 in | Wt 240.0 lb

## 2021-02-19 DIAGNOSIS — D124 Benign neoplasm of descending colon: Secondary | ICD-10-CM

## 2021-02-19 DIAGNOSIS — D125 Benign neoplasm of sigmoid colon: Secondary | ICD-10-CM

## 2021-02-19 DIAGNOSIS — Z1211 Encounter for screening for malignant neoplasm of colon: Secondary | ICD-10-CM | POA: Diagnosis not present

## 2021-02-19 DIAGNOSIS — K640 First degree hemorrhoids: Secondary | ICD-10-CM

## 2021-02-19 DIAGNOSIS — K5730 Diverticulosis of large intestine without perforation or abscess without bleeding: Secondary | ICD-10-CM

## 2021-02-19 DIAGNOSIS — D123 Benign neoplasm of transverse colon: Secondary | ICD-10-CM

## 2021-02-19 DIAGNOSIS — D122 Benign neoplasm of ascending colon: Secondary | ICD-10-CM

## 2021-02-19 MED ORDER — SODIUM CHLORIDE 0.9 % IV SOLN: 500.0000 mL | INTRAVENOUS | Status: DC

## 2021-02-19 NOTE — Progress Notes (Signed)
Vs CW I have reviewed the patient's medical history in detail and updated the computerized patient record.   

## 2021-02-19 NOTE — Progress Notes (Signed)
PT taken to PACU. Monitors in place. VSS. Report given to RN. 

## 2021-02-19 NOTE — Progress Notes (Signed)
Pt's states no medical or surgical changes since previsit or office visit. 

## 2021-02-19 NOTE — Op Note (Signed)
Christopher Blackburn Patient Name: Christopher Blackburn Procedure Date: 02/19/2021 11:22 AM MRN: [id number] Endoscopist: Gerrit Heck , MD Age: 60 Referring MD:  Date of Birth: October 15, 1960 Gender: Male Account #: [id number] Procedure:                Colonoscopy Indications:              Surveillance: Personal history of colonic polyps                            (unknown histology) on last colonoscopy more than 5                            years ago                           Last colonoscopy was 12 years ago at outside                            facility and notable for 5 polyps (location, size,                            histology otherwise unknown). Medicines:                Monitored Anesthesia Care Procedure:                Pre-Anesthesia Assessment:                           - Prior to the procedure, a History and Physical                            was performed, and patient medications and                            allergies were reviewed. The patient's tolerance of                            previous anesthesia was also reviewed. The risks                            and benefits of the procedure and the sedation                            options and risks were discussed with the patient.                            All questions were answered, and informed consent                            was obtained. Prior Anticoagulants: The patient has                            taken no previous anticoagulant or antiplatelet  agents. ASA Grade Assessment: II - A patient with                            mild systemic disease. After reviewing the risks                            and benefits, the patient was deemed in                            satisfactory condition to undergo the procedure.                           After obtaining informed consent, the colonoscope                            was passed under direct vision. Throughout the                             procedure, the patient's blood pressure, pulse, and                            oxygen saturations were monitored continuously. The                            CF [id number] #[id number] was introduced through the anus                            and advanced to the the terminal ileum. The                            colonoscopy was performed without difficulty. The                            patient tolerated the procedure well. The quality                            of the bowel preparation was good. The terminal                            ileum, ileocecal valve, appendiceal orifice, and                            rectum were photographed. Scope In: 11:24:31 AM Scope Out: 11:42:50 AM Scope Withdrawal Time: 0 hours 16 minutes 25 seconds  Total Procedure Duration: 0 hours 18 minutes 19 seconds  Findings:                 The perianal and digital rectal examinations were                            normal.                           Six sessile polyps were found in the sigmoid colon                            (  3), descending colon, transverse colon, and                            ascending colon. The polyps were 3 to 8 mm in size.                            These polyps were removed with a cold snare.                            Resection and retrieval were complete. Estimated                            blood loss was minimal.                           Multiple small and large-mouthed diverticula were                            found in the sigmoid colon.                           Non-bleeding internal hemorrhoids were found during                            retroflexion. The hemorrhoids were small.                           The terminal ileum appeared normal. Complications:            No immediate complications. Estimated Blood Loss:     Estimated blood loss was minimal. Impression:               - Six 3 to 8 mm polyps in the sigmoid colon, in the                            descending colon, in the  transverse colon and in                            the ascending colon, removed with a cold snare.                            Resected and retrieved.                           - Diverticulosis in the sigmoid colon.                           - Non-bleeding internal hemorrhoids.                           - The examined portion of the ileum was normal. Recommendation:           - Patient has a contact number available for  emergencies. The signs and symptoms of potential                            delayed complications were discussed with the                            patient. Return to normal activities tomorrow.                            Written discharge instructions were provided to the                            patient.                           - Resume previous diet.                           - Continue present medications.                           - Await pathology results.                           - Repeat colonoscopy for surveillance based on                            pathology results.                           - Return to GI office PRN.                           - Internal hemorrhoids were noted on this study and                            may be amenable to hemorrhoid band ligation. If you                            are interested in further treatment of these                            hemorrhoids with band ligation, please contact my                            clinic to set up an appointment for evaluation and                            treatment. Gerrit Heck, MD 02/19/2021 11:47:49 AM

## 2021-02-19 NOTE — Patient Instructions (Signed)
HANDOUTS PROVIDED ON: POLYPS, DIVERTICULOSIS, & HEMORRHOIDS (BANDING INFO)  The polyps removed today have been sent for pathology.  The results can take 1-3 weeks to receive.  When your next colonoscopy should occur will be based on the pathology results.    You may resume your previous diet and medication schedule.  Thank you for allowing [place] to care for you today!!!   YOU HAD AN ENDOSCOPIC PROCEDURE TODAY AT [facility]:   Refer to the procedure report that was given to you for any specific questions about what was found during the examination.  If the procedure report does not answer your questions, please call your gastroenterologist to clarify.  If you requested that your care partner not be given the details of your procedure findings, then the procedure report has been included in a sealed envelope for you to review at your convenience later.  YOU SHOULD EXPECT: Some feelings of bloating in the abdomen. Passage of more gas than usual.  Walking can help get rid of the air that was put into your GI tract during the procedure and reduce the bloating. If you had a lower endoscopy (such as a colonoscopy or flexible sigmoidoscopy) you may notice spotting of blood in your stool or on the toilet paper. If you underwent a bowel prep for your procedure, you may not have a normal bowel movement for a few days.  Please Note:  You might notice some irritation and congestion in your nose or some drainage.  This is from the oxygen used during your procedure.  There is no need for concern and it should clear up in a day or so.  SYMPTOMS TO REPORT IMMEDIATELY:  Following lower endoscopy (colonoscopy or flexible sigmoidoscopy):  Excessive amounts of blood in the stool  Significant tenderness or worsening of abdominal pains  Swelling of the abdomen that is new, acute  Fever of 100F or higher  For urgent or emergent issues, a gastroenterologist can be reached at any hour by calling [phone]  [phone]. Do not use MyChart messaging for urgent concerns.    DIET:  We do recommend a small meal at first, but then you may proceed to your regular diet.  Drink plenty of fluids but you should avoid alcoholic beverages for 24 hours.  ACTIVITY:  You should plan to take it easy for the rest of today and you should NOT DRIVE or use heavy machinery until tomorrow (because of the sedation medicines used during the test).    FOLLOW UP: Our staff will call the number listed on your records Wednesday morning between 7:15 am and 8:15 am to check on you and address any questions or concerns that you may have regarding the information given to you following your procedure. If we do not reach you, we will leave a message.  We will attempt to reach you two times.  During this call, we will ask if you have developed any symptoms of COVID 19. If you develop any symptoms (ie: fever, flu-like symptoms, shortness of breath, cough etc.) before then, please call [phone].  If you test positive for Covid 19 in the 2 weeks post procedure, please call and report this information to [place].    If any biopsies were taken you will be contacted by phone or by letter within the next 1-3 weeks.  Please call us at [phone] if you have not heard about the biopsies in 3 weeks.    SIGNATURES/CONFIDENTIALITY: You and/or your care partner have  signed paperwork which will be entered into your electronic medical record.  These signatures attest to the fact that that the information above on your After Visit Summary has been reviewed and is understood.  Full responsibility of the confidentiality of this discharge information lies with you and/or your care-partner.

## 2021-02-20 ENCOUNTER — Encounter: Payer: Self-pay | Admitting: Adult Health

## 2021-02-20 DIAGNOSIS — N5201 Erectile dysfunction due to arterial insufficiency: Secondary | ICD-10-CM

## 2021-02-21 ENCOUNTER — Telehealth: Payer: Self-pay

## 2021-02-21 MED ORDER — SILDENAFIL CITRATE 20 MG PO TABS: ORAL_TABLET | ORAL | 3 refills | Status: DC

## 2021-02-21 NOTE — Telephone Encounter (Signed)
No answer, unable to leave a message, B.Gean Larose RN. 

## 2021-02-21 NOTE — Telephone Encounter (Signed)
No answer, unable to leave a message, B.Lorean Ekstrand RN. 

## 2021-02-22 ENCOUNTER — Encounter: Payer: Self-pay | Admitting: Gastroenterology

## 2021-03-22 ENCOUNTER — Other Ambulatory Visit: Payer: Self-pay | Admitting: Family Medicine

## 2021-03-22 DIAGNOSIS — E7800 Pure hypercholesterolemia, unspecified: Secondary | ICD-10-CM

## 2021-04-16 ENCOUNTER — Encounter: Payer: Self-pay | Admitting: Adult Health

## 2021-04-17 ENCOUNTER — Other Ambulatory Visit: Payer: Self-pay | Admitting: Adult Health

## 2021-04-17 DIAGNOSIS — E7800 Pure hypercholesterolemia, unspecified: Secondary | ICD-10-CM

## 2021-04-17 MED ORDER — ATORVASTATIN CALCIUM 20 MG PO TABS: 20.0000 mg | ORAL_TABLET | Freq: Every day | ORAL | 3 refills | Status: DC

## 2021-04-17 NOTE — Telephone Encounter (Signed)
Noted  

## 2021-04-17 NOTE — Telephone Encounter (Signed)
Ok to send same Rx in with Sig.?

## 2021-05-16 ENCOUNTER — Other Ambulatory Visit: Payer: Self-pay | Admitting: Adult Health

## 2021-05-16 DIAGNOSIS — G4700 Insomnia, unspecified: Secondary | ICD-10-CM

## 2021-08-30 ENCOUNTER — Encounter: Payer: Self-pay | Admitting: Adult Health

## 2021-08-30 ENCOUNTER — Ambulatory Visit: Payer: BC Managed Care – PPO | Admitting: Adult Health

## 2021-08-30 VITALS — BP 130/82 | HR 83 | Temp 98.7°F | Ht 70.0 in | Wt 252.0 lb

## 2021-08-30 DIAGNOSIS — K648 Other hemorrhoids: Secondary | ICD-10-CM

## 2021-08-30 DIAGNOSIS — R600 Localized edema: Secondary | ICD-10-CM

## 2021-08-30 DIAGNOSIS — I209 Angina pectoris, unspecified: Secondary | ICD-10-CM | POA: Diagnosis not present

## 2021-08-30 MED ORDER — HYDROCORTISONE ACETATE 25 MG RE SUPP: 25.0000 mg | Freq: Two times a day (BID) | RECTAL | 4 refills | Status: DC | PRN

## 2021-08-30 MED ORDER — RANOLAZINE ER 500 MG PO TB12: 500.0000 mg | ORAL_TABLET | Freq: Two times a day (BID) | ORAL | 0 refills | Status: DC

## 2021-08-30 NOTE — Progress Notes (Signed)
Subjective:    Patient ID: Christopher Blackburn, male    DOB: 1961/03/23, 61 y.o.   MRN: [id number]  HPI 61 year old male who is being evaluated today for multiple issues.  First issue is that of concern of hemorrhoids.  He does report intermittent episodes of itchiness, burning, and bright red blood on the toilet paper.  He did have a recent colonoscopy which showed 2 internal hemorrhoids.  He has been using Preparation H pads without resolution.  Additionally, he has noticed intermittent lower extremity edema that is worse in the evening.  He was advised that only increased his Norvasc to 10 mg at this may happen.  He does not eat a high sodium diet, tries to stay hydrated to some degree throughout the day.  Angina pectoris-was seen by cardiology in 11/2019 and prescribed Ranexa 500 mg twice daily.  He did have a cardiac stress test done that was normal.  Does not remember taking the Ranexa prescription, continues to have midsternal to left-sided chest pain with extreme exertion such as walking up an incline.  Does not want to go back to cardiology at this point.   Review of Systems See HPI   Past Medical History:  Diagnosis Date   Arthritis    GERD (gastroesophageal reflux disease)    Hyperlipidemia    Hypertension     Social History   Socioeconomic History   Marital status: Married    Spouse name: Not on file   Number of children: 2   Years of education: Not on file   Highest education level: 12th grade  Occupational History   Occupation: Company secretary    Comment: building school buses  Tobacco Use   Smoking status: Former    Packs/day: 1.00    Years: 15.00    Pack years: 15.00    Types: Cigarettes    Quit date: 09/23/1992    Years since quitting: 28.9   Smokeless tobacco: Never  Vaping Use   Vaping Use: Never used  Substance and Sexual Activity   Alcohol use: Yes    Alcohol/week: 2.0 standard drinks    Types: 2 Shots of liquor per week    Comment:  daily   Drug use: Never   Sexual activity: Yes    Partners: Female  Other Topics Concern   Not on file  Social History Narrative   Not on file   Social Determinants of Health   Financial Resource Strain: Low Risk    Difficulty of Paying Living Expenses: Not hard at all  Food Insecurity: No Food Insecurity   Worried About Charity fundraiser in the Last Year: Never true   [place] in the Last Year: Never true  Transportation Needs: No Transportation Needs   Lack of Transportation (Medical): No   Lack of Transportation (Non-Medical): No  Physical Activity: Insufficiently Active   Days of Exercise per Week: 4 days   Minutes of Exercise per Session: 20 min  Stress: Stress Concern Present   Feeling of Stress : To some extent  Social Connections: Moderately Integrated   Frequency of Communication with Friends and Family: Twice a week   Frequency of Social Gatherings with Friends and Family: Once a week   Attends Religious Services: 1 to 4 times per year   Active Member of [employer] or Organizations: No   Attends Music therapist: Not on file   Marital Status: Married  Human resources officer Violence: Not on file    Past  Surgical History:  Procedure Laterality Date   ANKLE SURGERY Right 2015   COLONOSCOPY     INGUINAL HERNIA REPAIR Right 1995   SHOULDER SURGERY      Family History  Problem Relation Age of Onset   Colon polyps Maternal Grandmother    Breast cancer Maternal Grandmother    Colon cancer Maternal Grandmother    Heart disease Maternal Grandfather    Stroke Maternal Grandfather    Esophageal cancer Neg Hx    Stomach cancer Neg Hx    Rectal cancer Neg Hx     Allergies  Allergen Reactions   Lisinopril Swelling    Angioedema   Losartan Anaphylaxis and Swelling    Lip swelling    Current Outpatient Medications on File Prior to Visit  Medication Sig Dispense Refill   Ascorbic Acid (VITAMIN C) 1000 MG tablet Take  1,000 mg by mouth daily.     aspirin EC 81 MG tablet Take 1 tablet (81 mg total) by mouth daily. 90 tablet 3   atorvastatin (LIPITOR) 20 MG tablet Take 1 tablet (20 mg total) by mouth daily. 90 tablet 3   cetirizine (ZYRTEC) 10 MG tablet Take 10 mg by mouth daily.     Cholecalciferol (VITAMIN D-3) 125 MCG (5000 UT) TABS Take 5,000 Units by mouth daily.     esomeprazole (NEXIUM) 20 MG capsule Take 20 mg by mouth daily at 12 noon.     ibuprofen (ADVIL) 800 MG tablet Take 1 tablet (800 mg total) by mouth every 8 (eight) hours as needed. 30 tablet 2   Polyethylene Glycol 3350 (MIRALAX PO) Take 238 g by mouth. Colonoscopy prep     sildenafil (REVATIO) 20 MG tablet TAKE THREE TO FIVE TABLETS BY MOUTH DAILY FOURTY-FIVE MINUTES PRIOR TO ACTIVITY AS NEEDED 50 tablet 3   traZODone (DESYREL) 100 MG tablet TAKE 1 TABLET AT BEDTIME 90 tablet 1   No current facility-administered medications on file prior to visit.    BP 130/82 (BP Location: Left Arm, Patient Position: Sitting, Cuff Size: Normal)    Pulse 83    Temp 98.7 F (37.1 C) (Oral)    Ht 5\' 10"  (1.778 m)    Wt 252 lb (114.3 kg)    SpO2 98%    BMI 36.16 kg/m   Wt Readings from Last 3 Encounters:  08/30/21 252 lb (114.3 kg)  02/19/21 240 lb (108.9 kg)  01/30/21 240 lb (108.9 kg)       Objective:   Physical Exam Vitals and nursing note reviewed.  Constitutional:      Appearance: Normal appearance.  Cardiovascular:     Rate and Rhythm: Normal rate and regular rhythm.     Pulses: Normal pulses.     Heart sounds: Normal heart sounds.  Pulmonary:     Effort: Pulmonary effort is normal.     Breath sounds: Normal breath sounds.  Musculoskeletal:     Right lower leg: 1+ Pitting Edema present.     Left lower leg: 1+ Pitting Edema present.  Skin:    General: Skin is warm and dry.  Neurological:     General: No focal deficit present.     Mental Status: He is alert and oriented to person, place, and time.  Psychiatric:        Mood  and Affect: Mood normal.        Behavior: Behavior normal.        Thought Content: Thought content normal.       Assessment &  Plan:  1. Internal hemorrhoids  - hydrocortisone (ANUSOL-HC) 25 MG suppository; Place 1 suppository (25 mg total) rectally 2 (two) times daily as needed for hemorrhoids or anal itching.  Dispense: 12 suppository; Refill: 4  2. Lower extremity edema -He has very mild lower extremity edema today.  We discussed changing his blood pressure medication but his blood pressure is pretty well controlled.  He can use compression socks and elevation as needed.  We will hold off on changing medications at this point  3. Angina pectoris (HCC)  - ranolazine (RANEXA) 500 MG 12 hr tablet; Take 1 tablet (500 mg total) by mouth 2 (two) times daily.  Dispense: 180 tablet; Refill: 0  Christopher Peng, NP

## 2021-09-19 DIAGNOSIS — M25571 Pain in right ankle and joints of right foot: Secondary | ICD-10-CM | POA: Diagnosis not present

## 2021-10-01 IMAGING — DX DG CHEST 2V
2 series · 2 of 2 positions shown · non-contrast
Comparison: None.

CLINICAL DATA: Shortness of breath

EXAM:
CHEST - 2 VIEW

[chest lat (1 of 2)]
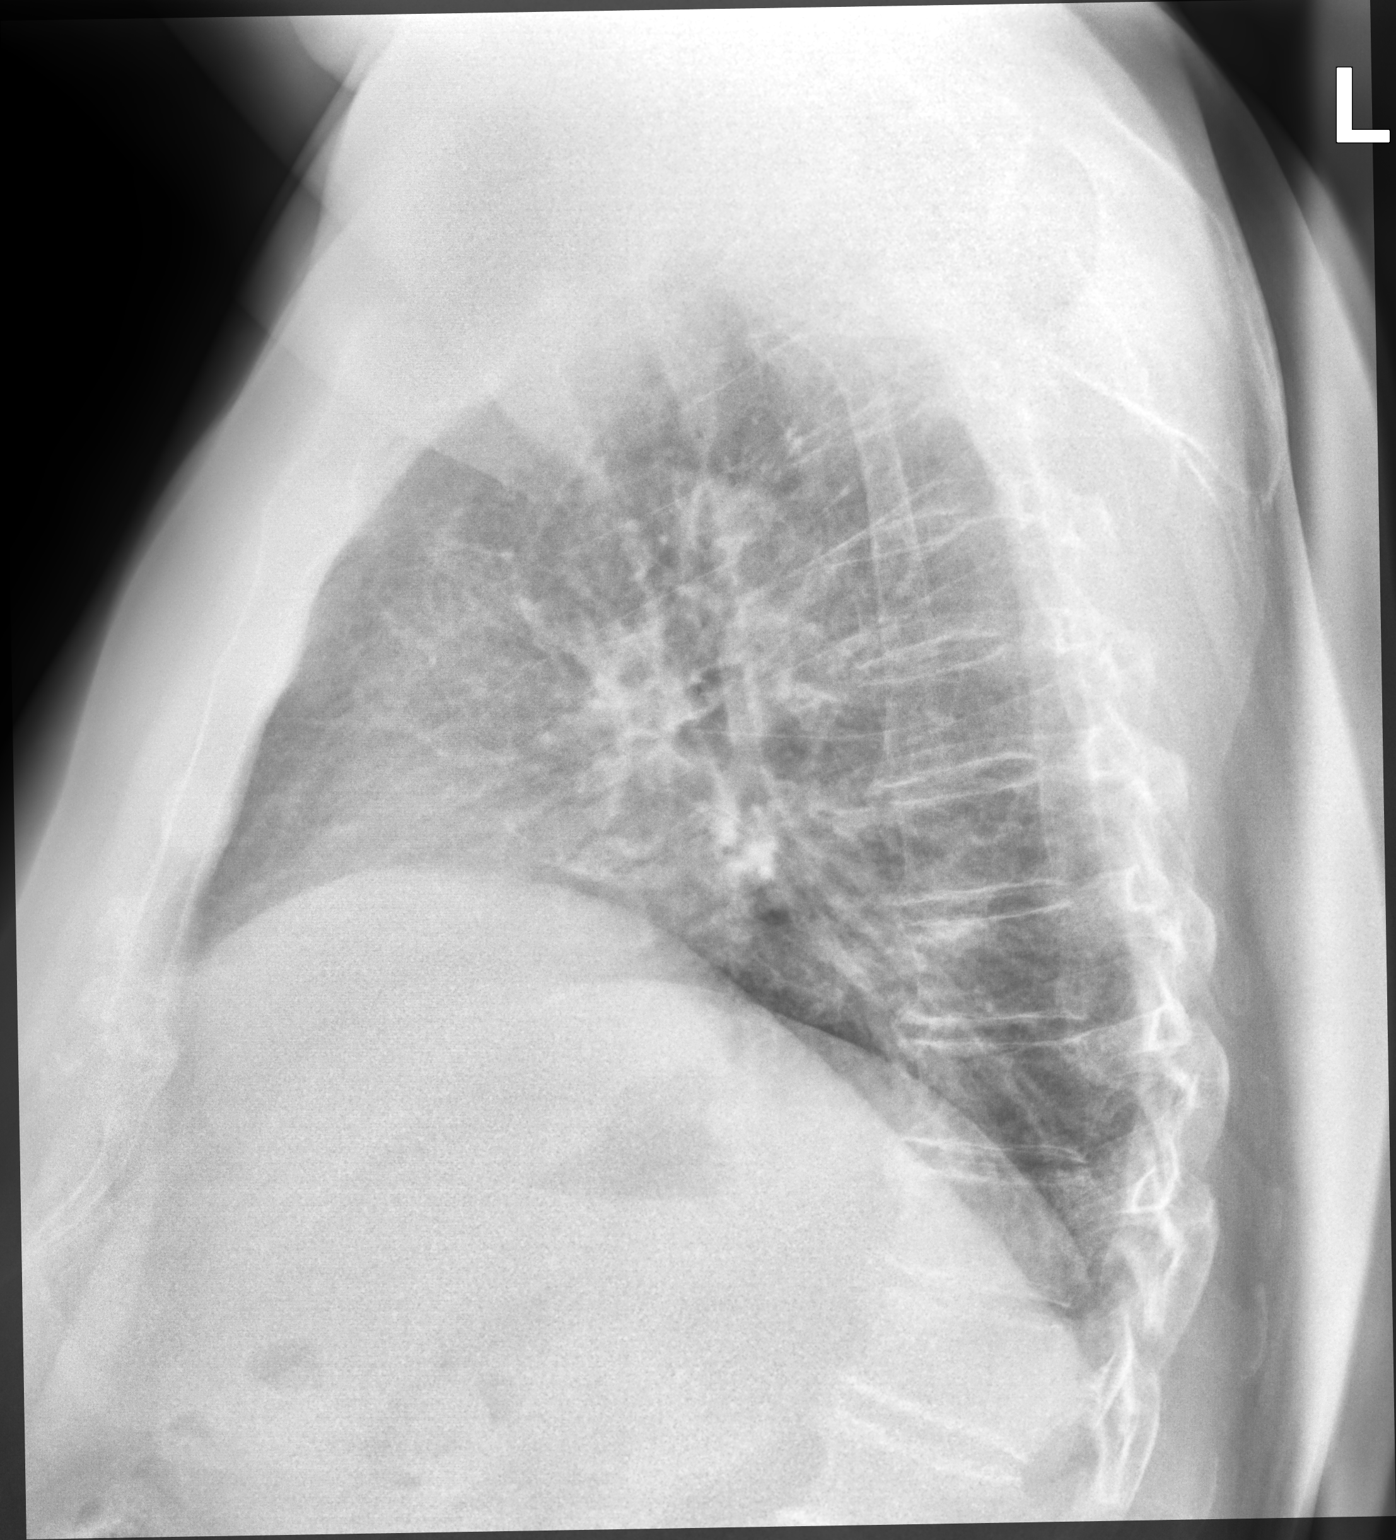

[chest lat (2 of 2)]
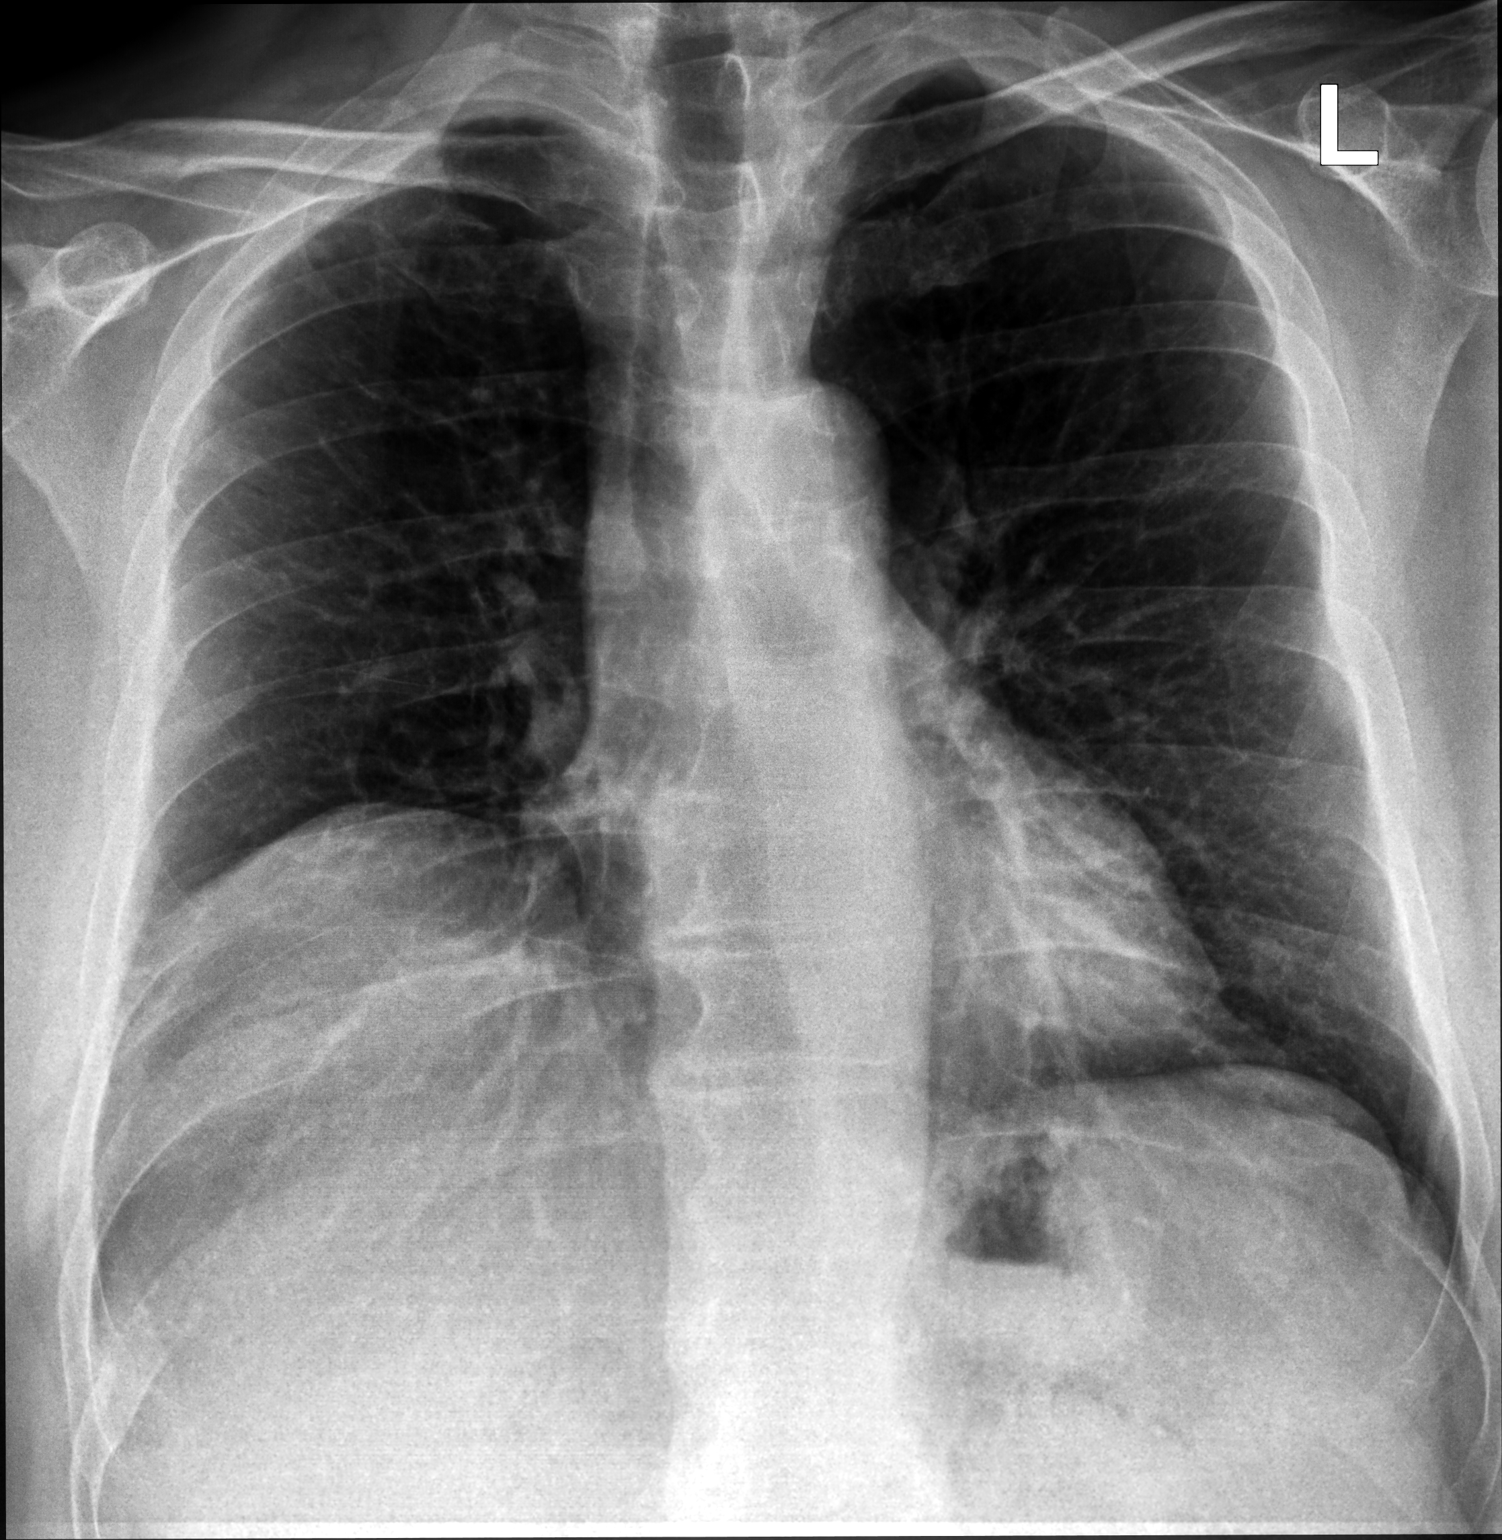

[2 of 2 positions shown; findings below may reference images not displayed]

FINDINGS: Cardiac shadows within normal limits. The lungs are well aerated
bilaterally. Mild pleural and parenchymal scarring is noted
bilaterally. No focal infiltrate or sizable effusion is noted.
Elevation of the right hemidiaphragm is noted. No bony abnormality
is seen.
IMPRESSION: Chronic scarring without acute abnormality.

## 2021-11-12 ENCOUNTER — Other Ambulatory Visit: Payer: Self-pay | Admitting: Adult Health

## 2021-11-12 DIAGNOSIS — G4700 Insomnia, unspecified: Secondary | ICD-10-CM

## 2021-12-04 ENCOUNTER — Encounter: Payer: Self-pay | Admitting: Adult Health

## 2021-12-04 ENCOUNTER — Ambulatory Visit (INDEPENDENT_AMBULATORY_CARE_PROVIDER_SITE_OTHER): Payer: BC Managed Care – PPO | Admitting: Adult Health

## 2021-12-04 VITALS — BP 130/80 | HR 74 | Temp 98.7°F | Ht 69.25 in | Wt 235.0 lb

## 2021-12-04 DIAGNOSIS — Z114 Encounter for screening for human immunodeficiency virus [HIV]: Secondary | ICD-10-CM

## 2021-12-04 DIAGNOSIS — Z23 Encounter for immunization: Secondary | ICD-10-CM | POA: Diagnosis not present

## 2021-12-04 DIAGNOSIS — Z125 Encounter for screening for malignant neoplasm of prostate: Secondary | ICD-10-CM

## 2021-12-04 DIAGNOSIS — G4700 Insomnia, unspecified: Secondary | ICD-10-CM

## 2021-12-04 DIAGNOSIS — I10 Essential (primary) hypertension: Secondary | ICD-10-CM | POA: Diagnosis not present

## 2021-12-04 DIAGNOSIS — Z1159 Encounter for screening for other viral diseases: Secondary | ICD-10-CM

## 2021-12-04 DIAGNOSIS — Z0000 Encounter for general adult medical examination without abnormal findings: Secondary | ICD-10-CM

## 2021-12-04 DIAGNOSIS — E668 Other obesity: Secondary | ICD-10-CM

## 2021-12-04 DIAGNOSIS — E785 Hyperlipidemia, unspecified: Secondary | ICD-10-CM

## 2021-12-04 LAB — LIPID PANEL
Cholesterol: 147 mg/dL (ref 0–200)
HDL: 48.7 mg/dL (ref >39.00)
LDL Cholesterol: 75 mg/dL (ref 0–99)
NonHDL: 97.98
Total CHOL/HDL Ratio: 3
Triglycerides: 114 mg/dL (ref 0.0–149.0)
VLDL: 22.8 mg/dL (ref 0.0–40.0)

## 2021-12-04 LAB — CBC WITH DIFFERENTIAL/PLATELET
Basophils Absolute: 0 10*3/uL (ref 0.0–0.1)
Basophils Relative: 0.4 % (ref 0.0–3.0)
Eosinophils Absolute: 0.1 10*3/uL (ref 0.0–0.7)
Eosinophils Relative: 1 % (ref 0.0–5.0)
HCT: 44.3 % (ref 39.0–52.0)
Hemoglobin: 15.3 g/dL (ref 13.0–17.0)
Lymphocytes Relative: 22.4 % (ref 12.0–46.0)
Lymphs Abs: 2.1 10*3/uL (ref 0.7–4.0)
MCHC: 34.5 g/dL (ref 30.0–36.0)
MCV: 86.1 fl (ref 78.0–100.0)
Monocytes Absolute: 0.5 10*3/uL (ref 0.1–1.0)
Monocytes Relative: 5.5 % (ref 3.0–12.0)
Neutro Abs: 6.7 10*3/uL (ref 1.4–7.7)
Neutrophils Relative %: 70.7 % (ref 43.0–77.0)
Platelets: 308 10*3/uL (ref 150.0–400.0)
RBC: 5.14 Mil/uL (ref 4.22–5.81)
RDW: 14.4 % (ref 11.5–15.5)
WBC: 9.4 10*3/uL (ref 4.0–10.5)

## 2021-12-04 LAB — COMPREHENSIVE METABOLIC PANEL
ALT: 29 U/L (ref 0–53)
AST: 24 U/L (ref 0–37)
Albumin: 4.5 g/dL (ref 3.5–5.2)
Alkaline Phosphatase: 75 U/L (ref 39–117)
BUN: 11 mg/dL (ref 6–23)
CO2: 25 mEq/L (ref 19–32)
Calcium: 9.2 mg/dL (ref 8.4–10.5)
Chloride: 102 mEq/L (ref 96–112)
Creatinine, Ser: 0.9 mg/dL (ref 0.40–1.50)
GFR: 92.16 mL/min (ref >60.00)
Glucose, Bld: 105 mg/dL — ABNORMAL HIGH (ref 70–99)
Potassium: 4.3 mEq/L (ref 3.5–5.1)
Sodium: 139 mEq/L (ref 135–145)
Total Bilirubin: 0.8 mg/dL (ref 0.2–1.2)
Total Protein: 7.2 g/dL (ref 6.0–8.3)

## 2021-12-04 LAB — PSA: PSA: 0.43 ng/mL (ref 0.10–4.00)

## 2021-12-04 LAB — TSH: TSH: 0.89 u[IU]/mL (ref 0.35–5.50)

## 2021-12-04 NOTE — Progress Notes (Signed)
? ?Subjective:  ? ? Patient ID: Christopher Blackburn, male    DOB: 10-17-1960, 61 y.o.   MRN: [id number] ? ?HPI ?Patient presents for yearly preventative medicine examination. He is a pleasant 61 year old male who  has a past medical history of Arthritis, GERD (gastroesophageal reflux disease), Hyperlipidemia, and Hypertension. ? ?HTN -currently managed with Norvasc 10 mg ?Lightheadedness, headache or blurred vision. He has not noticed any issue with di ?BP Readings from Last 3 Encounters:  ?12/04/21 130/80  ?08/30/21 130/82  ?02/19/21 (!) 137/91  ? ?Hyperlipidemia-currently prescribed Lipitor 20 mg daily.  He denies myalgia or fatigue ?Lab Results  ?Component Value Date  ? CHOL 136 11/30/2020  ? HDL 44.60 11/30/2020  ? [place] 58 11/30/2020  ? LDLDIRECT 136.0 04/19/2020  ? TRIG 168.0 (H) 11/30/2020  ? CHOLHDL 3 11/30/2020  ? ?ED - uses viagra PRN  ? ?Insomnia - Takes Trazodone 100 mg QHS. Feels as though he sleep pretty well.  ? ?Obesity - has been working on weight loss. Is doing weight watchers and walking. He has been able to lose about 17 pounds. Overall feels as lot better. Is not having the chest pain and SOB that he had in the past.  ? ?All immunizations and health maintenance protocols were reviewed with the patient and needed orders were placed. ? ?Appropriate screening laboratory values were ordered for the patient including screening of hyperlipidemia, renal function and hepatic function. ?If indicated by BPH, a PSA was ordered. ? ?Medication reconciliation,  past medical history, social history, problem list and allergies were reviewed in detail with the patient ? ?Goals were established with regard to weight loss, exercise, and  diet in compliance with medications ?Wt Readings from Last 3 Encounters:  ?12/04/21 235 lb (106.6 kg)  ?08/30/21 252 lb (114.3 kg)  ?02/19/21 240 lb (108.9 kg)  ? ?Review of Systems  ?Constitutional: Negative.   ?HENT: Negative.    ?Eyes: Negative.   ?Respiratory: Negative.     ?Cardiovascular: Negative.   ?Gastrointestinal: Negative.   ?Endocrine: Negative.   ?Genitourinary: Negative.   ?Musculoskeletal: Negative.   ?Skin: Negative.   ?Allergic/Immunologic: Negative.   ?Neurological: Negative.   ?Hematological: Negative.   ?Psychiatric/Behavioral: Negative.    ?All other systems reviewed and are negative. ? ?Past Medical History:  ?Diagnosis Date  ? Arthritis   ? GERD (gastroesophageal reflux disease)   ? Hyperlipidemia   ? Hypertension   ? ? ?Social History  ? ?Socioeconomic History  ? Marital status: Married  ?  Spouse name: Not on file  ? Number of children: 2  ? Years of education: Not on file  ? Highest education level: 12th grade  ?Occupational History  ? 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 Smoking status: Former  ?  Packs/day: 1.00  ?  Years: 15.00  ?  Pack years: 15.00  ?  Types: Cigarettes  ?  Quit date: 09/23/1992  ?  Years since quitting: 29.2  ? Smokeless tobacco: Never  ?Vaping Use  ? Vaping Use: Never used  ?Substance and Sexual Activity  ? Alcohol use: Yes  ?  Alcohol/week: 2.0 standard drinks  ?  Types: 2 Shots of liquor per week  ?  Comment: daily  ? Drug use: Never  ? Sexual activity: Yes  ?  Partners: Female  ?Other Topics Concern  ? Not on file  ?Social History Narrative  ? Not on file  ? ?Social Determinants of Health  ? ?  Financial Resource Strain: Low Risk   ? Difficulty of Paying Living Expenses: Not hard at all  ?Food Insecurity: No Food Insecurity  ? Worried About Charity fundraiser in the Last Year: Never true  ? Ran Out of Food in the Last Year: Never true  ?Transportation Needs: No Transportation Needs  ? Lack of Transportation (Medical): No  ? Lack of Transportation (Non-Medical): No  ?Physical Activity: Insufficiently Active  ? Days of Exercise per Week: 4 days  ? Minutes of Exercise per Session: 20 min  ?Stress: Stress Concern Present  ? Feeling of Stress : To some extent  ?Social Connections: Moderately Integrated  ?  Frequency of Communication with Friends and Family: Twice a week  ? Frequency of Social Gatherings with Friends and Family: Once a week  ? Attends Religious Services: 1 to 4 times per year  ? Active Member of Clubs or Organizations: No  ? Attends Archivist Meetings: Not on file  ? Marital Status: Married  ?Intimate Partner Violence: Not on file  ? ? ?Past Surgical History:  ?Procedure Laterality Date  ? ANKLE SURGERY Right 2015  ? COLONOSCOPY    ? INGUINAL HERNIA REPAIR Right 1995  ? SHOULDER SURGERY    ? ? ?Family History  ?Problem Relation Age of Onset  ? Colon polyps Maternal Grandmother   ? Breast cancer Maternal Grandmother   ? Colon cancer Maternal Grandmother   ? Heart disease Maternal Grandfather   ? Stroke Maternal Grandfather   ? Esophageal cancer Neg Hx   ? Stomach cancer Neg Hx   ? Rectal cancer Neg Hx   ? ? ?Allergies  ?Allergen Reactions  ? Lisinopril Swelling  ?  Angioedema  ? Losartan Anaphylaxis and Swelling  ?  Lip swelling  ? ? ?Current Outpatient Medications on File Prior to Visit  ?Medication Sig Dispense Refill  ? amLODipine (NORVASC) 10 MG tablet Take 10 mg by mouth daily.    ? Ascorbic Acid (VITAMIN C) 1000 MG tablet Take 1,000 mg by mouth daily.    ? aspirin EC 81 MG tablet Take 1 tablet (81 mg total) by mouth daily. 90 tablet 3  ? atorvastatin (LIPITOR) 20 MG tablet Take 1 tablet (20 mg total) by mouth daily. 90 tablet 3  ? cetirizine (ZYRTEC) 10 MG tablet Take 10 mg by mouth daily.    ? Cholecalciferol (VITAMIN D-3) 125 MCG (5000 UT) TABS Take 5,000 Units by mouth daily.    ? esomeprazole (NEXIUM) 20 MG capsule Take 20 mg by mouth daily at 12 noon.    ? ibuprofen (ADVIL) 800 MG tablet Take 1 tablet (800 mg total) by mouth every 8 (eight) hours as needed. 30 tablet 2  ? sildenafil (REVATIO) 20 MG tablet TAKE THREE TO FIVE TABLETS BY MOUTH DAILY FOURTY-FIVE MINUTES PRIOR TO ACTIVITY AS NEEDED 50 tablet 3  ? traZODone (DESYREL) 100 MG tablet TAKE 1 TABLET AT BEDTIME 90 tablet 3   ? ?No current facility-administered medications on file prior to visit.  ? ? ?BP 130/80   Pulse 74   Temp 98.7 ?F (37.1 ?C) (Oral)   Ht 5' 9.25" (1.759 m)   Wt 235 lb (106.6 kg)   SpO2 97%   BMI 34.45 kg/m?  ? ? ?   ?Objective:  ? Physical Exam ?Vitals and nursing note reviewed.  ?Constitutional:   ?   General: He is not in acute distress. ?   Appearance: Normal appearance. He is well-developed and normal weight.  ?  HENT:  ?   Head: Normocephalic and atraumatic.  ?   Right Ear: Tympanic membrane, ear canal and external ear normal. There is no impacted cerumen.  ?   Left Ear: Tympanic membrane, ear canal and external ear normal. There is no impacted cerumen.  ?   Nose: Nose normal. No congestion or rhinorrhea.  ?   Mouth/Throat:  ?   Mouth: Mucous membranes are moist.  ?   Pharynx: Oropharynx is clear. No oropharyngeal exudate or posterior oropharyngeal erythema.  ?Eyes:  ?   General:     ?   Right eye: No discharge.     ?   Left eye: No discharge.  ?   Extraocular Movements: Extraocular movements intact.  ?   Conjunctiva/sclera: Conjunctivae normal.  ?   Pupils: Pupils are equal, round, and reactive to light.  ?Neck:  ?   Vascular: No carotid bruit.  ?   Trachea: No tracheal deviation.  ?Cardiovascular:  ?   Rate and Rhythm: Normal rate and regular rhythm.  ?   Pulses: Normal pulses.  ?   Heart sounds: Normal heart sounds. No murmur heard. ?  No friction rub. No gallop.  ?Pulmonary:  ?   Effort: Pulmonary effort is normal. No respiratory distress.  ?   Breath sounds: Normal breath sounds. No stridor. No wheezing, rhonchi or rales.  ?Chest:  ?   Chest wall: No tenderness.  ?Abdominal:  ?   General: Bowel sounds are normal. There is no distension.  ?   Palpations: Abdomen is soft. There is no mass.  ?   Tenderness: There is no abdominal tenderness. There is no right CVA tenderness, left CVA tenderness, guarding or rebound.  ?   Hernia: No hernia is present.  ?Musculoskeletal:     ?   General: No swelling,  tenderness, deformity or signs of injury. Normal range of motion.  ?   Right lower leg: No edema.  ?   Left lower leg: No edema.  ?Lymphadenopathy:  ?   Cervical: No cervical adenopathy.  ?Skin: ?   General: Skin

## 2021-12-04 NOTE — Patient Instructions (Addendum)
It was great seeing you today!  ? ?Keep up with the weight loss - you are doing amazing  ? ?We will follow up with you regarding your lab work  ? ?Please let me know if you need anything  ? ? ? ?

## 2021-12-05 LAB — HEPATITIS C ANTIBODY
Hepatitis C Ab: NONREACTIVE
SIGNAL TO CUT-OFF: 0.13 (ref <1.00)

## 2021-12-05 LAB — HIV ANTIBODY (ROUTINE TESTING W REFLEX): HIV 1&2 Ab, 4th Generation: NONREACTIVE

## 2021-12-16 ENCOUNTER — Other Ambulatory Visit: Payer: Self-pay | Admitting: Adult Health

## 2021-12-16 DIAGNOSIS — I10 Essential (primary) hypertension: Secondary | ICD-10-CM

## 2022-03-25 ENCOUNTER — Other Ambulatory Visit: Payer: Self-pay | Admitting: Adult Health

## 2022-03-25 DIAGNOSIS — E7800 Pure hypercholesterolemia, unspecified: Secondary | ICD-10-CM

## 2022-04-08 ENCOUNTER — Other Ambulatory Visit: Payer: Self-pay | Admitting: Adult Health

## 2022-04-17 ENCOUNTER — Other Ambulatory Visit: Payer: Self-pay | Admitting: Adult Health

## 2022-04-17 DIAGNOSIS — N5201 Erectile dysfunction due to arterial insufficiency: Secondary | ICD-10-CM

## 2022-08-26 DIAGNOSIS — M25562 Pain in left knee: Secondary | ICD-10-CM | POA: Diagnosis not present

## 2022-09-02 ENCOUNTER — Other Ambulatory Visit: Payer: Self-pay | Admitting: Adult Health

## 2022-09-02 DIAGNOSIS — N5201 Erectile dysfunction due to arterial insufficiency: Secondary | ICD-10-CM

## 2022-09-23 ENCOUNTER — Other Ambulatory Visit: Payer: Self-pay | Admitting: Adult Health

## 2022-09-23 DIAGNOSIS — M25562 Pain in left knee: Secondary | ICD-10-CM | POA: Diagnosis not present

## 2022-09-23 DIAGNOSIS — E7800 Pure hypercholesterolemia, unspecified: Secondary | ICD-10-CM

## 2022-09-26 ENCOUNTER — Telehealth: Payer: Self-pay

## 2022-09-26 NOTE — Patient Outreach (Signed)
  Care Coordination   Initial Visit Note   09/26/2022 Name: Kewan Mcnease MRN: [id number] DOB: 07-11-1961  Tonna Corner Esau is a 62 y.o. year old male who sees Nafziger, Tommi Rumps, NP for primary care. I spoke with  Dorothe Pea by phone today.  What matters to the patients health and wellness today?  None     Goals Addressed             This Visit's Progress    COMPLETED: Care Coordination Activities-No follow up required       Interventions Today    Flowsheet Row Most Recent Value  General Interventions   General Interventions Discussed/Reviewed Doctor Visits, Health Screening  Doctor Visits Discussed/Reviewed Doctor Visits Discussed, Annual Wellness Visits  Health Screening Colonoscopy              SDOH assessments and interventions completed:  Yes  SDOH Interventions Today    Flowsheet Row Most Recent Value  SDOH Interventions   Food Insecurity Interventions Intervention Not Indicated  Housing Interventions Intervention Not Indicated        Care Coordination Interventions:  Yes, provided   Follow up plan: No further intervention required.   Encounter Outcome:  Pt. Visit Completed {THN Tip this will not be part of the note when signed-REQUIRED REPORT FIELD DO NOT DELETE (Optional):[id number]  Jone Baseman, RN, MSN [facility] Care Management Coordinator Direct Line [phone]

## 2022-09-26 NOTE — Patient Instructions (Signed)
Visit Information  Thank you for taking time to visit with me today. Please don't hesitate to contact me if I can be of assistance to you.   Following are the goals we discussed today:   Goals Addressed             This Visit's Progress    COMPLETED: Care Coordination Activities-No follow up required       Interventions Today    Flowsheet Row Most Recent Value  General Interventions   General Interventions Discussed/Reviewed Doctor Visits, Health Screening  Doctor Visits Discussed/Reviewed Doctor Visits Discussed, Annual Wellness Visits  Health Screening Colonoscopy               If you are experiencing a Mental Health or [place] or need someone to talk to, please call the Suicide and Crisis Lifeline: [phone]   Patient verbalizes understanding of instructions and care plan provided today and agrees to view in [place]. Active MyChart status and patient understanding of how to access instructions and care plan via MyChart confirmed with patient.     No further follow up required: decline  Jone Baseman, RN, MSN [facility] Care Management Coordinator Direct Line [phone]

## 2022-10-18 ENCOUNTER — Encounter: Payer: Self-pay | Admitting: Adult Health

## 2022-10-18 ENCOUNTER — Other Ambulatory Visit: Payer: Self-pay | Admitting: Adult Health

## 2022-10-18 MED ORDER — ALPRAZOLAM 0.25 MG PO TABS: 0.2500 mg | ORAL_TABLET | Freq: Two times a day (BID) | ORAL | 0 refills | Status: DC | PRN

## 2022-10-18 NOTE — Telephone Encounter (Signed)
Please advise 

## 2022-10-27 ENCOUNTER — Encounter: Payer: Self-pay | Admitting: Adult Health

## 2022-11-07 ENCOUNTER — Other Ambulatory Visit: Payer: Self-pay | Admitting: Adult Health

## 2022-11-07 DIAGNOSIS — G4700 Insomnia, unspecified: Secondary | ICD-10-CM

## 2022-12-03 DIAGNOSIS — H903 Sensorineural hearing loss, bilateral: Secondary | ICD-10-CM | POA: Diagnosis not present

## 2022-12-05 ENCOUNTER — Ambulatory Visit: Payer: BC Managed Care – PPO | Admitting: Adult Health

## 2022-12-06 ENCOUNTER — Encounter: Payer: Self-pay | Admitting: Adult Health

## 2022-12-06 ENCOUNTER — Ambulatory Visit (INDEPENDENT_AMBULATORY_CARE_PROVIDER_SITE_OTHER): Payer: BC Managed Care – PPO | Admitting: Adult Health

## 2022-12-06 VITALS — BP 138/80 | HR 100 | Temp 97.5°F | Ht 69.0 in | Wt 251.0 lb

## 2022-12-06 DIAGNOSIS — E785 Hyperlipidemia, unspecified: Secondary | ICD-10-CM | POA: Diagnosis not present

## 2022-12-06 DIAGNOSIS — N5201 Erectile dysfunction due to arterial insufficiency: Secondary | ICD-10-CM

## 2022-12-06 DIAGNOSIS — Z0000 Encounter for general adult medical examination without abnormal findings: Secondary | ICD-10-CM

## 2022-12-06 DIAGNOSIS — E668 Other obesity: Secondary | ICD-10-CM | POA: Diagnosis not present

## 2022-12-06 DIAGNOSIS — I10 Essential (primary) hypertension: Secondary | ICD-10-CM

## 2022-12-06 DIAGNOSIS — G4700 Insomnia, unspecified: Secondary | ICD-10-CM

## 2022-12-06 DIAGNOSIS — Z125 Encounter for screening for malignant neoplasm of prostate: Secondary | ICD-10-CM | POA: Diagnosis not present

## 2022-12-06 DIAGNOSIS — F419 Anxiety disorder, unspecified: Secondary | ICD-10-CM

## 2022-12-06 DIAGNOSIS — F32A Depression, unspecified: Secondary | ICD-10-CM

## 2022-12-06 LAB — PSA: PSA: 0.42 ng/mL (ref 0.10–4.00)

## 2022-12-06 LAB — CBC
HCT: 45.7 % (ref 39.0–52.0)
Hemoglobin: 15.9 g/dL (ref 13.0–17.0)
MCHC: 34.7 g/dL (ref 30.0–36.0)
MCV: 85.4 fl (ref 78.0–100.0)
Platelets: 334 10*3/uL (ref 150.0–400.0)
RBC: 5.35 Mil/uL (ref 4.22–5.81)
RDW: 13.5 % (ref 11.5–15.5)
WBC: 9.4 10*3/uL (ref 4.0–10.5)

## 2022-12-06 LAB — COMPREHENSIVE METABOLIC PANEL
ALT: 18 U/L (ref 0–53)
AST: 15 U/L (ref 0–37)
Albumin: 4.1 g/dL (ref 3.5–5.2)
Alkaline Phosphatase: 78 U/L (ref 39–117)
BUN: 17 mg/dL (ref 6–23)
CO2: 25 mEq/L (ref 19–32)
Calcium: 9 mg/dL (ref 8.4–10.5)
Chloride: 103 mEq/L (ref 96–112)
Creatinine, Ser: 0.89 mg/dL (ref 0.40–1.50)
GFR: 91.82 mL/min (ref >60.00)
Glucose, Bld: 102 mg/dL — ABNORMAL HIGH (ref 70–99)
Potassium: 4.2 mEq/L (ref 3.5–5.1)
Sodium: 137 mEq/L (ref 135–145)
Total Bilirubin: 0.7 mg/dL (ref 0.2–1.2)
Total Protein: 6.8 g/dL (ref 6.0–8.3)

## 2022-12-06 LAB — LIPID PANEL
Cholesterol: 123 mg/dL (ref 0–200)
HDL: 35.4 mg/dL — ABNORMAL LOW (ref >39.00)
LDL Cholesterol: 63 mg/dL (ref 0–99)
NonHDL: 87.49
Total CHOL/HDL Ratio: 3
Triglycerides: 120 mg/dL (ref 0.0–149.0)
VLDL: 24 mg/dL (ref 0.0–40.0)

## 2022-12-06 LAB — TSH: TSH: 1.26 u[IU]/mL (ref 0.35–5.50)

## 2022-12-06 NOTE — Progress Notes (Signed)
Subjective:    Patient ID: Christopher Blackburn, male    DOB: Apr 02, 1961, 62 y.o.   MRN: [id number]  HPI Patient presents for yearly preventative medicine examination. He is a pleasant 62 year old male who  has a past medical history of Arthritis, GERD (gastroesophageal reflux disease), Hyperlipidemia, and Hypertension.  HTN -currently managed with Norvasc 10 mg. He denies Lightheadedness, headache or blurred vision.  BP Readings from Last 3 Encounters:  12/06/22 138/80  12/04/21 130/80  08/30/21 130/82    Hyperlipidemia-currently prescribed Lipitor 20 mg daily.  He denies myalgia or fatigue Lab Results  Component Value Date   CHOL 147 12/04/2021   HDL 48.70 12/04/2021   LDLCALC 75 12/04/2021   LDLDIRECT 136.0 04/19/2020   TRIG 114.0 12/04/2021   CHOLHDL 3 12/04/2021   ED - uses viagra PRN   Insomnia - Takes Trazodone 100 mg QHS. Feels as though he sleep pretty well.   Obesity - He has put all his weight back on as he has not been exercising as much. He is has gotten out of his routine, his mother-in-law passed away and he was having issues with his knee.  He did go see his orthopedic doctor and had an injection into his knee, currently the knee is fine but he is having some left hip pain that he is going to see his orthopedic about.  He also feels as though anxiety and depression that he has been experiencing lately have been a contributing factor to the weight gain.  He is no longer feeling as though he enjoys anxiety once did and he has a lot of anxiety dealing with his brother-in-law and the estate of his mother-in-law. He is back to eating healthy.  Wt Readings from Last 3 Encounters:  12/06/22 251 lb (113.9 kg)  12/04/21 235 lb (106.6 kg)  08/30/21 252 lb (114.3 kg)   All immunizations and health maintenance protocols were reviewed with the patient and needed orders were placed.  Appropriate screening laboratory values were ordered for the patient including screening of  hyperlipidemia, renal function and hepatic function. If indicated by BPH, a PSA was ordered.  Medication reconciliation,  past medical history, social history, problem list and allergies were reviewed in detail with the patient  Goals were established with regard to weight loss, exercise, and  diet in compliance with medications  He is up to date on routine colon cancer screening   Review of Systems  Constitutional: Negative.   HENT:  Positive for tinnitus.   Eyes: Negative.   Respiratory: Negative.    Cardiovascular: Negative.   Gastrointestinal: Negative.   Endocrine: Negative.   Genitourinary: Negative.   Musculoskeletal:  Positive for arthralgias.  Skin: Negative.   Allergic/Immunologic: Negative.   Neurological: Negative.   Hematological: Negative.   Psychiatric/Behavioral:  Positive for agitation and dysphoric mood. Negative for sleep disturbance. The patient is nervous/anxious.   All other systems reviewed and are negative.  Past Medical History:  Diagnosis Date   Arthritis    GERD (gastroesophageal reflux disease)    Hyperlipidemia    Hypertension     Social History   Socioeconomic History   Marital status: Married    Spouse name: Not on file   Number of children: 2   Years of education: Not on file   Highest education level: 12th grade  Occupational History   Occupation: team leader    Comment: building school buses  Tobacco Use   Smoking status: Former  Packs/day: 1.00    Years: 15.00    Additional pack years: 0.00    Total pack years: 15.00    Types: Cigarettes    Quit date: 09/23/1992    Years since quitting: 30.2   Smokeless tobacco: Never  Vaping Use   Vaping Use: Never used  Substance and Sexual Activity   Alcohol use: Yes    Alcohol/week: 2.0 standard drinks of alcohol    Types: 2 Shots of liquor per week    Comment: daily   Drug use: Never   Sexual activity: Yes    Partners: Female  Other Topics Concern   Not on file  Social History  Narrative   Not on file   Social Determinants of Health   Financial Resource Strain: Low Risk  (08/29/2021)   Overall Financial Resource Strain (CARDIA)    Difficulty of Paying Living Expenses: Not hard at all  Food Insecurity: No Food Insecurity (09/26/2022)   Hunger Vital Sign    Worried About Running Out of Food in the Last Year: Never true    Ran Out of Food in the Last Year: Never true  Transportation Needs: No Transportation Needs (08/29/2021)   PRAPARE - Administrator, Civil Service (Medical): No    Lack of Transportation (Non-Medical): No  Physical Activity: Insufficiently Active (08/29/2021)   Exercise Vital Sign    Days of Exercise per Week: 4 days    Minutes of Exercise per Session: 20 min  Stress: Stress Concern Present (08/29/2021)   [employer] of Occupational Health - Occupational Stress Questionnaire    Feeling of Stress : To some extent  Social Connections: Moderately Integrated (08/29/2021)   Social Connection and Isolation Panel [NHANES]    Frequency of Communication with Friends and Family: Twice a week    Frequency of Social Gatherings with Friends and Family: Once a week    Attends Religious Services: 1 to 4 times per year    Active Member of [employer] or Organizations: No    Attends Engineer, structural: Not on file    Marital Status: Married  Catering manager Violence: Not on file    Past Surgical History:  Procedure Laterality Date   ANKLE SURGERY Right 2015   COLONOSCOPY     INGUINAL HERNIA REPAIR Right 1995   SHOULDER SURGERY      Family History  Problem Relation Age of Onset   Colon polyps Maternal Grandmother    Breast cancer Maternal Grandmother    Colon cancer Maternal Grandmother    Heart disease Maternal Grandfather    Stroke Maternal Grandfather    Esophageal cancer Neg Hx    Stomach cancer Neg Hx    Rectal cancer Neg Hx     Allergies  Allergen Reactions   Lisinopril Swelling    Angioedema   Losartan  Anaphylaxis and Swelling    Lip swelling    Current Outpatient Medications on File Prior to Visit  Medication Sig Dispense Refill   ALPRAZolam (XANAX) 0.25 MG tablet Take 1 tablet (0.25 mg total) by mouth 2 (two) times daily as needed for anxiety. 10 tablet 0   amLODipine (NORVASC) 10 MG tablet Take 1 tablet (10 mg total) by mouth daily. 90 tablet 3   Ascorbic Acid (VITAMIN C) 1000 MG tablet Take 1,000 mg by mouth daily.     aspirin EC 81 MG tablet Take 1 tablet (81 mg total) by mouth daily. 90 tablet 3   atorvastatin (LIPITOR) 20 MG tablet TAKE  1 TABLET DAILY 90 tablet 3   cetirizine (ZYRTEC) 10 MG tablet Take 10 mg by mouth daily.     Cholecalciferol (VITAMIN D-3) 125 MCG (5000 UT) TABS Take 5,000 Units by mouth daily.     esomeprazole (NEXIUM) 20 MG capsule Take 20 mg by mouth daily at 12 noon.     IBU 800 MG tablet TAKE ONE TABLET BY MOUTH EVERY 8 HOURS AS NEEDED 30 tablet 2   sildenafil (REVATIO) 20 MG tablet TAKE 3-5 TABLETS BY MOUTH ONCE DAILY 45 MINUTES PRIOR TO ACTIVITY AS NEEDED 50 tablet 0   traZODone (DESYREL) 100 MG tablet TAKE 1 TABLET AT BEDTIME 90 tablet 3   No current facility-administered medications on file prior to visit.    BP 138/80   Pulse 100   Temp (!) 97.5 F (36.4 C) (Oral)   Ht 5\' 9"  (1.753 m)   Wt 251 lb (113.9 kg)   SpO2 96%   BMI 37.07 kg/m       Objective:   Physical Exam Vitals and nursing note reviewed.  Constitutional:      General: He is not in acute distress.    Appearance: Normal appearance. He is obese. He is not ill-appearing.  HENT:     Head: Normocephalic and atraumatic.     Right Ear: Tympanic membrane, ear canal and external ear normal. There is no impacted cerumen.     Left Ear: Tympanic membrane, ear canal and external ear normal. There is no impacted cerumen.     Nose: Nose normal. No congestion or rhinorrhea.     Mouth/Throat:     Mouth: Mucous membranes are moist.     Pharynx: Oropharynx is clear.  Eyes:     Extraocular  Movements: Extraocular movements intact.     Conjunctiva/sclera: Conjunctivae normal.     Pupils: Pupils are equal, round, and reactive to light.  Neck:     Vascular: No carotid bruit.  Cardiovascular:     Rate and Rhythm: Normal rate and regular rhythm.     Pulses: Normal pulses.     Heart sounds: No murmur heard.    No friction rub. No gallop.  Pulmonary:     Effort: Pulmonary effort is normal.     Breath sounds: Normal breath sounds.  Abdominal:     General: Abdomen is flat. Bowel sounds are normal. There is no distension.     Palpations: Abdomen is soft. There is no mass.     Tenderness: There is no abdominal tenderness. There is no guarding or rebound.     Hernia: No hernia is present.  Musculoskeletal:        General: Normal range of motion.     Cervical back: Normal range of motion and neck supple.  Lymphadenopathy:     Cervical: No cervical adenopathy.  Skin:    General: Skin is warm and dry.     Capillary Refill: Capillary refill takes less than 2 seconds.  Neurological:     General: No focal deficit present.     Mental Status: He is alert and oriented to person, place, and time.  Psychiatric:        Mood and Affect: Mood normal.        Behavior: Behavior normal.        Thought Content: Thought content normal.        Judgment: Judgment normal.       Assessment & Plan:  1. Routine general medical examination at a health care facility  Today patient counseled on age appropriate routine health concerns for screening and prevention, each reviewed and up to date or declined. Immunizations reviewed and up to date or declined. Labs ordered and reviewed. Risk factors for depression reviewed and negative. Hearing function and visual acuity are intact. ADLs screened and addressed as needed. Functional ability and level of safety reviewed and appropriate. Education, counseling and referrals performed based on assessed risks today. Patient provided with a copy of personalized plan  for preventive services.   2. Essential hypertension - Controlled. No change in medication  - Lipid panel; Future - TSH; Future - CBC; Future - Comprehensive metabolic panel; Future - Comprehensive metabolic panel - CBC - TSH - Lipid panel  3. Other obesity  - Lipid panel; Future - TSH; Future - CBC; Future - Comprehensive metabolic panel; Future - Comprehensive metabolic panel - CBC - TSH - Lipid panel  4. Insomnia, unspecified type - Continue Trazodone  - Lipid panel; Future - TSH; Future - CBC; Future - Comprehensive metabolic panel; Future - Comprehensive metabolic panel - CBC - TSH - Lipid panel  5. Prostate cancer screening  - PSA; Future - PSA  6. Dyslipidemia - Consider increase in statin  - Lipid panel; Future - TSH; Future - CBC; Future - Comprehensive metabolic panel; Future - Comprehensive metabolic panel - CBC - TSH - Lipid panel  7. Erectile dysfunction due to arterial insufficiency - Continue with Viagra PRN  - Lipid panel; Future - TSH; Future - CBC; Future - Comprehensive metabolic panel; Future - Comprehensive metabolic panel - CBC - TSH - Lipid panel  8. Anxiety and depression Flowsheet Row Office Visit from 12/06/2022 in [facility] [place]  PHQ-9 Total Score 11         12/06/2022    8:58 AM 04/19/2020   10:24 AM 10/18/2019   11:15 AM  GAD 7 : Generalized Anxiety Score  Nervous, Anxious, on Edge 3 0 0  Control/stop worrying 3 0 0  Worry too much - different things 3 0 0  Trouble relaxing 1 0 0  Restless 0 0 0  Easily annoyed or irritable 1 0 0  Afraid - awful might happen 0 0 0  Total GAD 7 Score 11 0 0  Anxiety Difficulty Somewhat difficult Not difficult at all Not difficult at all   -We discussed medication or trying to get back into a routine of exercise and eating healthy.  Shared decision making we ultimately decided to have him work on getting into an exercise regimen, he would like to  start swimming and playing golf again.  Will see how he does with this and have him follow-up in 30 days.   Shirline Frees, NP

## 2022-12-22 ENCOUNTER — Other Ambulatory Visit: Payer: Self-pay | Admitting: Adult Health

## 2022-12-22 DIAGNOSIS — I10 Essential (primary) hypertension: Secondary | ICD-10-CM

## 2023-01-07 ENCOUNTER — Ambulatory Visit: Payer: BC Managed Care – PPO | Admitting: Adult Health

## 2023-04-17 ENCOUNTER — Other Ambulatory Visit: Payer: Self-pay | Admitting: Adult Health

## 2023-04-25 DIAGNOSIS — M533 Sacrococcygeal disorders, not elsewhere classified: Secondary | ICD-10-CM | POA: Diagnosis not present

## 2023-04-25 DIAGNOSIS — M4317 Spondylolisthesis, lumbosacral region: Secondary | ICD-10-CM | POA: Diagnosis not present

## 2023-04-25 DIAGNOSIS — M5459 Other low back pain: Secondary | ICD-10-CM | POA: Diagnosis not present

## 2023-04-30 ENCOUNTER — Encounter: Payer: Self-pay | Admitting: Adult Health

## 2023-04-30 DIAGNOSIS — E7800 Pure hypercholesterolemia, unspecified: Secondary | ICD-10-CM

## 2023-04-30 DIAGNOSIS — G4700 Insomnia, unspecified: Secondary | ICD-10-CM

## 2023-04-30 DIAGNOSIS — N5201 Erectile dysfunction due to arterial insufficiency: Secondary | ICD-10-CM

## 2023-05-02 ENCOUNTER — Other Ambulatory Visit: Payer: Self-pay | Admitting: Adult Health

## 2023-05-02 DIAGNOSIS — I10 Essential (primary) hypertension: Secondary | ICD-10-CM

## 2023-05-02 MED ORDER — SILDENAFIL CITRATE 20 MG PO TABS
ORAL_TABLET | ORAL | 0 refills | Status: DC
Start: 2023-05-02 — End: 2023-10-21

## 2023-05-02 MED ORDER — AMLODIPINE BESYLATE 10 MG PO TABS
10.0000 mg | ORAL_TABLET | Freq: Every day | ORAL | 3 refills | Status: DC
Start: 2023-05-02 — End: 2024-04-06

## 2023-05-02 MED ORDER — ALPRAZOLAM 0.25 MG PO TABS: 0.2500 mg | ORAL_TABLET | Freq: Two times a day (BID) | ORAL | 1 refills | Status: DC | PRN

## 2023-05-02 MED ORDER — TRAZODONE HCL 100 MG PO TABS: ORAL_TABLET | ORAL | 3 refills | Status: DC

## 2023-05-02 MED ORDER — ATORVASTATIN CALCIUM 20 MG PO TABS: 20.0000 mg | ORAL_TABLET | Freq: Every day | ORAL | 3 refills | Status: DC

## 2023-05-05 DIAGNOSIS — M5459 Other low back pain: Secondary | ICD-10-CM | POA: Diagnosis not present

## 2023-05-05 DIAGNOSIS — R6889 Other general symptoms and signs: Secondary | ICD-10-CM | POA: Diagnosis not present

## 2023-05-05 DIAGNOSIS — M4317 Spondylolisthesis, lumbosacral region: Secondary | ICD-10-CM | POA: Diagnosis not present

## 2023-06-06 DIAGNOSIS — M533 Sacrococcygeal disorders, not elsewhere classified: Secondary | ICD-10-CM | POA: Diagnosis not present

## 2023-06-06 DIAGNOSIS — M7062 Trochanteric bursitis, left hip: Secondary | ICD-10-CM | POA: Diagnosis not present

## 2023-07-24 ENCOUNTER — Encounter: Payer: Self-pay | Admitting: Adult Health

## 2023-07-25 ENCOUNTER — Other Ambulatory Visit: Payer: Self-pay | Admitting: Adult Health

## 2023-07-25 DIAGNOSIS — M7062 Trochanteric bursitis, left hip: Secondary | ICD-10-CM | POA: Diagnosis not present

## 2023-07-25 DIAGNOSIS — M4317 Spondylolisthesis, lumbosacral region: Secondary | ICD-10-CM | POA: Diagnosis not present

## 2023-07-25 MED ORDER — IBUPROFEN 800 MG PO TABS: 800.0000 mg | ORAL_TABLET | Freq: Three times a day (TID) | ORAL | 1 refills | Status: DC | PRN

## 2023-09-24 DIAGNOSIS — M4317 Spondylolisthesis, lumbosacral region: Secondary | ICD-10-CM | POA: Diagnosis not present

## 2023-09-24 DIAGNOSIS — M791 Myalgia, unspecified site: Secondary | ICD-10-CM | POA: Diagnosis not present

## 2023-09-25 DIAGNOSIS — M795 Residual foreign body in soft tissue: Secondary | ICD-10-CM | POA: Diagnosis not present

## 2023-10-20 DIAGNOSIS — M4317 Spondylolisthesis, lumbosacral region: Secondary | ICD-10-CM | POA: Diagnosis not present

## 2023-10-21 ENCOUNTER — Other Ambulatory Visit: Payer: Self-pay | Admitting: Adult Health

## 2023-10-21 DIAGNOSIS — N5201 Erectile dysfunction due to arterial insufficiency: Secondary | ICD-10-CM

## 2023-10-29 DIAGNOSIS — M5459 Other low back pain: Secondary | ICD-10-CM | POA: Diagnosis not present

## 2023-11-07 DIAGNOSIS — M5459 Other low back pain: Secondary | ICD-10-CM | POA: Diagnosis not present

## 2023-12-29 DIAGNOSIS — M7062 Trochanteric bursitis, left hip: Secondary | ICD-10-CM | POA: Diagnosis not present

## 2023-12-29 DIAGNOSIS — M25552 Pain in left hip: Secondary | ICD-10-CM | POA: Diagnosis not present

## 2023-12-30 ENCOUNTER — Ambulatory Visit: Admitting: Adult Health

## 2024-01-01 ENCOUNTER — Other Ambulatory Visit: Payer: Self-pay | Admitting: Medical Genetics

## 2024-01-08 ENCOUNTER — Ambulatory Visit (INDEPENDENT_AMBULATORY_CARE_PROVIDER_SITE_OTHER): Admitting: Adult Health

## 2024-01-08 VITALS — BP 130/82 | HR 90 | Temp 98.2°F | Ht 69.0 in | Wt 239.0 lb

## 2024-01-08 DIAGNOSIS — N5201 Erectile dysfunction due to arterial insufficiency: Secondary | ICD-10-CM | POA: Diagnosis not present

## 2024-01-08 DIAGNOSIS — I10 Essential (primary) hypertension: Secondary | ICD-10-CM | POA: Diagnosis not present

## 2024-01-08 DIAGNOSIS — E785 Hyperlipidemia, unspecified: Secondary | ICD-10-CM | POA: Diagnosis not present

## 2024-01-08 DIAGNOSIS — N401 Enlarged prostate with lower urinary tract symptoms: Secondary | ICD-10-CM | POA: Diagnosis not present

## 2024-01-08 DIAGNOSIS — E66812 Obesity, class 2: Secondary | ICD-10-CM | POA: Diagnosis not present

## 2024-01-08 DIAGNOSIS — R351 Nocturia: Secondary | ICD-10-CM

## 2024-01-08 DIAGNOSIS — Z0000 Encounter for general adult medical examination without abnormal findings: Secondary | ICD-10-CM | POA: Diagnosis not present

## 2024-01-08 DIAGNOSIS — G4700 Insomnia, unspecified: Secondary | ICD-10-CM

## 2024-01-08 DIAGNOSIS — Z8249 Family history of ischemic heart disease and other diseases of the circulatory system: Secondary | ICD-10-CM

## 2024-01-08 LAB — CBC
HCT: 46.3 % (ref 39.0–52.0)
Hemoglobin: 15.5 g/dL (ref 13.0–17.0)
MCHC: 33.5 g/dL (ref 30.0–36.0)
MCV: 84.6 fl (ref 78.0–100.0)
Platelets: 354 10*3/uL (ref 150.0–400.0)
RBC: 5.47 Mil/uL (ref 4.22–5.81)
RDW: 14.6 % (ref 11.5–15.5)
WBC: 13.6 10*3/uL — ABNORMAL HIGH (ref 4.0–10.5)

## 2024-01-08 LAB — LIPID PANEL
Cholesterol: 139 mg/dL (ref 0–200)
HDL: 41.2 mg/dL (ref >39.00)
LDL Cholesterol: 71 mg/dL (ref 0–99)
NonHDL: 97.9
Total CHOL/HDL Ratio: 3
Triglycerides: 133 mg/dL (ref 0.0–149.0)
VLDL: 26.6 mg/dL (ref 0.0–40.0)

## 2024-01-08 LAB — COMPREHENSIVE METABOLIC PANEL WITH GFR
ALT: 18 U/L (ref 0–53)
AST: 15 U/L (ref 0–37)
Albumin: 4.3 g/dL (ref 3.5–5.2)
Alkaline Phosphatase: 72 U/L (ref 39–117)
BUN: 18 mg/dL (ref 6–23)
CO2: 26 meq/L (ref 19–32)
Calcium: 9 mg/dL (ref 8.4–10.5)
Chloride: 103 meq/L (ref 96–112)
Creatinine, Ser: 0.86 mg/dL (ref 0.40–1.50)
GFR: 92.07 mL/min (ref >60.00)
Glucose, Bld: 94 mg/dL (ref 70–99)
Potassium: 4.3 meq/L (ref 3.5–5.1)
Sodium: 137 meq/L (ref 135–145)
Total Bilirubin: 0.9 mg/dL (ref 0.2–1.2)
Total Protein: 6.8 g/dL (ref 6.0–8.3)

## 2024-01-08 LAB — TSH: TSH: 0.89 u[IU]/mL (ref 0.35–5.50)

## 2024-01-08 LAB — PSA: PSA: 0.53 ng/mL (ref 0.10–4.00)

## 2024-01-08 LAB — HEMOGLOBIN A1C: Hgb A1c MFr Bld: 5.5 % (ref 4.6–6.5)

## 2024-01-08 MED ORDER — TAMSULOSIN HCL 0.4 MG PO CAPS: 0.4000 mg | ORAL_CAPSULE | Freq: Every day | ORAL | 3 refills | Status: AC

## 2024-01-08 NOTE — Progress Notes (Signed)
 Subjective:    Patient ID: Christopher Blackburn, male    DOB: 12-16-1960, 63 y.o.   MRN: [id number]  HPI Patient presents for yearly preventative medicine examination. He is a pleasant 63 year old male who  has a past medical history of Arthritis, GERD (gastroesophageal reflux disease), Hyperlipidemia, and Hypertension.  HTN -currently managed with Norvasc  10 mg. He denies Lightheadedness, headache or blurred vision.  BP Readings from Last 3 Encounters:  01/08/24 130/82  12/06/22 138/80  12/04/21 130/80   Hyperlipidemia-currently prescribed Lipitor 20 mg daily.  He denies myalgia or fatigue Lab Results  Component Value Date   CHOL 123 12/06/2022   HDL 35.40 (L) 12/06/2022   LDLCALC 63 12/06/2022   LDLDIRECT 136.0 04/19/2020   TRIG 120.0 12/06/2022   CHOLHDL 3 12/06/2022   ED - uses viagra  PRN   Insomnia - Takes Trazodone  100 mg QHS. Feels as though he sleep pretty well.   Obesity - He has cut back on portion sized and he is walking 3 miles a day with his dog. He also walks when he plays golf.  Wt Readings from Last 3 Encounters:  01/08/24 239 lb (108.4 kg)  12/06/22 251 lb (113.9 kg)  12/04/21 235 lb (106.6 kg)   BPH -   Sometime he is getting up 4 times a night to urinate other times once a night. He does have decreased stream most of the time.  Family History of Aortic Aneurysm - reports that he just found out that a close family member has an Aortic Aneurysm. He would like to be checked to see if he has one. He is a former smoker.   All immunizations and health maintenance protocols were reviewed with the patient and needed orders were placed.  Appropriate screening laboratory values were ordered for the patient including screening of hyperlipidemia, renal function and hepatic function. If indicated by BPH, a PSA was ordered.  Medication reconciliation,  past medical history, social history, problem list and allergies were reviewed in detail with the patient  Goals  were established with regard to weight loss, exercise, and  diet in compliance with medications  End of life planning was discussed.  He will be due in July 2025 for repeat colonoscopy   Review of Systems  Constitutional: Negative.   HENT: Negative.    Eyes: Negative.   Respiratory: Negative.    Cardiovascular: Negative.   Gastrointestinal: Negative.   Endocrine: Negative.   Genitourinary:  Positive for frequency.  Musculoskeletal: Negative.   Skin: Negative.   Allergic/Immunologic: Negative.   Neurological: Negative.   Hematological: Negative.   Psychiatric/Behavioral: Negative.    All other systems reviewed and are negative.  Past Medical History:  Diagnosis Date   Arthritis    GERD (gastroesophageal reflux disease)    Hyperlipidemia    Hypertension     Social History   Socioeconomic History   Marital status: Married    Spouse name: Not on file   Number of children: 2   Years of education: Not on file   Highest education level: 12th grade  Occupational History   Occupation: Careers adviser    Comment: building school buses  Tobacco Use   Smoking status: Former    Current packs/day: 0.00    Average packs/day: 1 pack/day for 15.0 years (15.0 ttl pk-yrs)    Types: Cigarettes    Start date: 09/23/1977    Quit date: 09/23/1992    Years since quitting: 31.3   Smokeless tobacco: Never  Vaping Use   Vaping status: Never Used  Substance and Sexual Activity   Alcohol use: Yes    Alcohol/week: 2.0 standard drinks of alcohol    Types: 2 Shots of liquor per week    Comment: daily   Drug use: Never   Sexual activity: Yes    Partners: Female  Other Topics Concern   Not on file  Social History Narrative   Not on file   Social Drivers of Health   Financial Resource Strain: Low Risk  (01/08/2024)   Overall Financial Resource Strain (CARDIA)    Difficulty of Paying Living Expenses: Not hard at all  Food Insecurity: No Food Insecurity (01/08/2024)   Hunger Vital Sign     Worried About Running Out of Food in the Last Year: Never true    Ran Out of Food in the Last Year: Never true  Transportation Needs: No Transportation Needs (01/08/2024)   PRAPARE - Administrator, Civil Service (Medical): No    Lack of Transportation (Non-Medical): No  Physical Activity: Sufficiently Active (01/08/2024)   Exercise Vital Sign    Days of Exercise per Week: 5 days    Minutes of Exercise per Session: 60 min  Stress: Stress Concern Present (01/08/2024)   [employer] of Occupational Health - Occupational Stress Questionnaire    Feeling of Stress : Very much  Social Connections: Moderately Isolated (01/08/2024)   Social Connection and Isolation Panel [NHANES]    Frequency of Communication with Friends and Family: More than three times a week    Frequency of Social Gatherings with Friends and Family: Twice a week    Attends Religious Services: Never    Database administrator or Organizations: No    Attends Engineer, structural: Not on file    Marital Status: Married  Catering manager Violence: Low Risk  (02/28/2020)   Received from [facility]   Intimate Partner Violence    Insults You: Not on file    Threatens You: Not on file    Screams at [place]: Not on file    Physically Hurt: Not on file    Intimate Partner Violence Score: Not on file    Past Surgical History:  Procedure Laterality Date   ANKLE SURGERY Right 2015   COLONOSCOPY     INGUINAL HERNIA REPAIR Right 1995   SHOULDER SURGERY      Family History  Problem Relation Age of Onset   Aneurysm Paternal Aunt    Aneurysm Paternal Uncle    Colon polyps Maternal Grandmother    Breast cancer Maternal Grandmother    Colon cancer Maternal Grandmother    Heart disease Maternal Grandfather    Stroke Maternal Grandfather    Aneurysm Paternal Grandfather    Aneurysm Cousin    Aneurysm Cousin    Esophageal cancer Neg Hx    Stomach cancer Neg Hx    Rectal cancer Neg Hx      Allergies  Allergen Reactions   Lisinopril  Swelling    Angioedema   Losartan  Anaphylaxis and Swelling    Lip swelling    Current Outpatient Medications on File Prior to Visit  Medication Sig Dispense Refill   amLODipine  (NORVASC ) 10 MG tablet Take 1 tablet (10 mg total) by mouth daily. 90 tablet 3   Ascorbic Acid (VITAMIN C) 1000 MG tablet Take 1,000 mg by mouth daily.     atorvastatin  (LIPITOR) 20 MG tablet Take 1 tablet (20 mg total) by mouth daily.  90 tablet 3   cetirizine (ZYRTEC) 10 MG tablet Take 10 mg by mouth daily.     Cholecalciferol (VITAMIN D-3) 125 MCG (5000 UT) TABS Take 5,000 Units by mouth daily.     esomeprazole (NEXIUM) 20 MG capsule Take 20 mg by mouth daily at 12 noon.     ibuprofen  (IBU) 800 MG tablet Take 1 tablet (800 mg total) by mouth every 8 (eight) hours as needed. 90 tablet 1   sildenafil  (REVATIO ) 20 MG tablet TAKE 3-5 TABLETS BY MOUTH ONCE DAILY 45 MINUTES PRIOR TO ACTIVITY AS NEEDED 50 tablet 0   traZODone  (DESYREL ) 100 MG tablet TAKE 1 TABLET AT BEDTIME 90 tablet 3   No current facility-administered medications on file prior to visit.    BP 130/82   Pulse 90   Temp 98.2 F (36.8 C) (Oral)   Ht 5\' 9"  (1.753 m)   Wt 239 lb (108.4 kg)   SpO2 97%   BMI 35.29 kg/m       Objective:   Physical Exam Vitals and nursing note reviewed.  Constitutional:      General: He is not in acute distress.    Appearance: Normal appearance. He is obese. He is not ill-appearing.  HENT:     Head: Normocephalic and atraumatic.     Right Ear: Tympanic membrane, ear canal and external ear normal. There is no impacted cerumen.     Left Ear: Tympanic membrane, ear canal and external ear normal. There is no impacted cerumen.     Nose: Nose normal. No congestion or rhinorrhea.     Mouth/Throat:     Mouth: Mucous membranes are moist.     Pharynx: Oropharynx is clear.  Eyes:     Extraocular Movements: Extraocular movements intact.     Conjunctiva/sclera:  Conjunctivae normal.     Pupils: Pupils are equal, round, and reactive to light.  Neck:     Vascular: No carotid bruit.  Cardiovascular:     Rate and Rhythm: Normal rate and regular rhythm.     Pulses: Normal pulses.     Heart sounds: No murmur heard.    No friction rub. No gallop.  Pulmonary:     Effort: Pulmonary effort is normal.     Breath sounds: Normal breath sounds.  Abdominal:     General: Abdomen is flat. Bowel sounds are normal. There is no distension.     Palpations: Abdomen is soft. There is no mass.     Tenderness: There is no abdominal tenderness. There is no guarding or rebound.     Hernia: No hernia is present.  Musculoskeletal:        General: Normal range of motion.     Cervical back: Normal range of motion and neck supple.  Lymphadenopathy:     Cervical: No cervical adenopathy.  Skin:    General: Skin is warm and dry.     Capillary Refill: Capillary refill takes less than 2 seconds.  Neurological:     General: No focal deficit present.     Mental Status: He is alert and oriented to person, place, and time.  Psychiatric:        Mood and Affect: Mood normal.        Behavior: Behavior normal.        Thought Content: Thought content normal.        Judgment: Judgment normal.           Assessment & Plan:  1. Routine general medical examination  at a health care facility (Primary) Today patient counseled on age appropriate routine health concerns for screening and prevention, each reviewed and up to date or declined. Immunizations reviewed and up to date or declined. Labs ordered and reviewed. Risk factors for depression reviewed and negative. Hearing function and visual acuity are intact. ADLs screened and addressed as needed. Functional ability and level of safety reviewed and appropriate. Education, counseling and referrals performed based on assessed risks today. Patient provided with a copy of personalized plan for preventive services. - Continue to work on  weight loss  - Follow up in one year or sooner if needed   2. Essential (primary) hypertension - Well controlled. No change in medication  - Lipid panel; Future - TSH; Future - CBC; Future - Comprehensive metabolic panel with GFR; Future - Hemoglobin A1c; Future  3. Dyslipidemia - Consider dose adjustment of statin  - Lipid panel; Future - TSH; Future - CBC; Future - Comprehensive metabolic panel with GFR; Future - Hemoglobin A1c; Future  4. Erectile dysfunction due to arterial insufficiency - Continue with Sildenafil    5. Insomnia, unspecified type - Continue with Trazodone    6. Obesity, class 2 - Continue with work on weight loss  - Lipid panel; Future - TSH; Future - CBC; Future - Comprehensive metabolic panel with GFR; Future - Hemoglobin A1c; Future  7. Benign prostatic hyperplasia with nocturia - Will start on Flomax  0.4 mg daily  - PSA; Future - tamsulosin  (FLOMAX ) 0.4 MG CAPS capsule; Take 1 capsule (0.4 mg total) by mouth daily.  Dispense: 90 capsule; Refill: 3  8. Family history of abdominal aortic aneurysm (AAA)  - US  AORTA DUPLEX COMPLETE; Future  Alto Atta, NP

## 2024-01-08 NOTE — Patient Instructions (Signed)
 It was great seeing you today   We will follow up with you regarding your lab work   Please let me know if you need anything

## 2024-01-09 ENCOUNTER — Ambulatory Visit: Payer: Self-pay | Admitting: Adult Health

## 2024-01-09 ENCOUNTER — Other Ambulatory Visit: Payer: Self-pay | Admitting: Adult Health

## 2024-01-09 DIAGNOSIS — D72829 Elevated white blood cell count, unspecified: Secondary | ICD-10-CM

## 2024-01-15 ENCOUNTER — Ambulatory Visit

## 2024-01-15 DIAGNOSIS — Z8249 Family history of ischemic heart disease and other diseases of the circulatory system: Secondary | ICD-10-CM

## 2024-01-21 ENCOUNTER — Other Ambulatory Visit

## 2024-01-21 DIAGNOSIS — D72829 Elevated white blood cell count, unspecified: Secondary | ICD-10-CM | POA: Diagnosis not present

## 2024-01-21 LAB — CBC
HCT: 44.8 % (ref 38.5–50.0)
Hemoglobin: 14.5 g/dL (ref 13.2–17.1)
MCH: 28.6 pg (ref 27.0–33.0)
MCHC: 32.4 g/dL (ref 32.0–36.0)
MCV: 88.4 fL (ref 80.0–100.0)
MPV: 8.1 fL (ref 7.5–12.5)
Platelets: 244 Thousand/uL (ref 140–400)
RBC: 5.07 Million/uL (ref 4.20–5.80)
RDW: 14.4 % (ref 11.0–15.0)
WBC: 10.7 Thousand/uL (ref 3.8–10.8)

## 2024-01-21 NOTE — Addendum Note (Signed)
 Addended by: Claudina Cullen on: 01/21/2024 10:40 AM   Modules accepted: Orders

## 2024-01-22 ENCOUNTER — Ambulatory Visit: Payer: Self-pay | Admitting: Adult Health

## 2024-01-22 NOTE — Telephone Encounter (Signed)
 FYI

## 2024-03-04 ENCOUNTER — Encounter: Payer: Self-pay | Admitting: Adult Health

## 2024-03-04 ENCOUNTER — Ambulatory Visit: Admitting: Adult Health

## 2024-03-04 VITALS — BP 138/82 | HR 105 | Temp 98.1°F | Ht 69.0 in | Wt 245.0 lb

## 2024-03-04 DIAGNOSIS — J029 Acute pharyngitis, unspecified: Secondary | ICD-10-CM

## 2024-03-04 LAB — POCT RAPID STREP A (OFFICE): Rapid Strep A Screen: NEGATIVE

## 2024-03-04 MED ORDER — PREDNISONE 10 MG PO TABS: ORAL_TABLET | ORAL | 0 refills | Status: AC

## 2024-03-04 MED ORDER — NAPROXEN 500 MG PO TABS: 500.0000 mg | ORAL_TABLET | Freq: Two times a day (BID) | ORAL | 0 refills | Status: AC

## 2024-03-04 NOTE — Progress Notes (Signed)
 Subjective:    Patient ID: Christopher Blackburn, male    DOB: 1961/06/28, 63 y.o.   MRN: [id number]  Sore Throat    63 year old male who  has a past medical history of Arthritis, GERD (gastroesophageal reflux disease), Hyperlipidemia, and Hypertension.  He presents to the office today for a sore throat that has been present for a month. He reports that the soreness has been more annoying than anything until yesterday when he throat became more sore and it was painful to swallow, this lasted all day. Today it is not as sore but still has discomfort.   He has not had any fevers or chills and does not feel acutely bad.    Review of Systems See HPI   Past Medical History:  Diagnosis Date   Arthritis    GERD (gastroesophageal reflux disease)    Hyperlipidemia    Hypertension     Social History   Socioeconomic History   Marital status: Married    Spouse name: Not on file   Number of children: 2   Years of education: Not on file   Highest education level: 12th grade  Occupational History   Occupation: Careers adviser    Comment: building school buses  Tobacco Use   Smoking status: Former    Current packs/day: 0.00    Average packs/day: 1 pack/day for 15.0 years (15.0 ttl pk-yrs)    Types: Cigarettes    Start date: 09/23/1977    Quit date: 09/23/1992    Years since quitting: 31.4   Smokeless tobacco: Never  Vaping Use   Vaping status: Never Used  Substance and Sexual Activity   Alcohol use: Yes    Alcohol/week: 2.0 standard drinks of alcohol    Types: 2 Shots of liquor per week    Comment: daily   Drug use: Never   Sexual activity: Yes    Partners: Female  Other Topics Concern   Not on file  Social History Narrative   Not on file   Social Drivers of Health   Financial Resource Strain: Low Risk  (01/08/2024)   Overall Financial Resource Strain (CARDIA)    Difficulty of Paying Living Expenses: Not hard at all  Food Insecurity: No Food Insecurity (01/08/2024)    Hunger Vital Sign    Worried About Running Out of Food in the Last Year: Never true    Ran Out of Food in the Last Year: Never true  Transportation Needs: No Transportation Needs (01/08/2024)   PRAPARE - Administrator, Civil Service (Medical): No    Lack of Transportation (Non-Medical): No  Physical Activity: Sufficiently Active (01/08/2024)   Exercise Vital Sign    Days of Exercise per Week: 5 days    Minutes of Exercise per Session: 60 min  Stress: Stress Concern Present (01/08/2024)   [employer] of Occupational Health - Occupational Stress Questionnaire    Feeling of Stress : Very much  Social Connections: Moderately Isolated (01/08/2024)   Social Connection and Isolation Panel    Frequency of Communication with Friends and Family: More than three times a week    Frequency of Social Gatherings with Friends and Family: Twice a week    Attends Religious Services: Never    Database administrator or Organizations: No    Attends Banker Meetings: Not on file    Marital Status: Married  Intimate Partner Violence: Low Risk  (02/28/2020)   Received from [facility]   Intimate  Partner Violence    Insults You: Not on file    Threatens You: Not on file    Screams at You: Not on file    Physically Hurt: Not on file    Intimate Partner Violence Score: Not on file    Past Surgical History:  Procedure Laterality Date   ANKLE SURGERY Right 2015   COLONOSCOPY     INGUINAL HERNIA REPAIR Right 1995   SHOULDER SURGERY      Family History  Problem Relation Age of Onset   Aneurysm Paternal Aunt    Aneurysm Paternal Uncle    Colon polyps Maternal Grandmother    Breast cancer Maternal Grandmother    Colon cancer Maternal Grandmother    Heart disease Maternal Grandfather    Stroke Maternal Grandfather    Aneurysm Paternal Grandfather    Aneurysm Cousin    Aneurysm Cousin    Esophageal cancer Neg Hx    Stomach cancer Neg Hx    Rectal cancer Neg Hx      Allergies  Allergen Reactions   Lisinopril  Swelling    Angioedema   Losartan  Anaphylaxis and Swelling    Lip swelling    Current Outpatient Medications on File Prior to Visit  Medication Sig Dispense Refill   amLODipine  (NORVASC ) 10 MG tablet Take 1 tablet (10 mg total) by mouth daily. 90 tablet 3   Ascorbic Acid (VITAMIN C) 1000 MG tablet Take 1,000 mg by mouth daily.     atorvastatin  (LIPITOR) 20 MG tablet Take 1 tablet (20 mg total) by mouth daily. 90 tablet 3   cetirizine (ZYRTEC) 10 MG tablet Take 10 mg by mouth daily.     Cholecalciferol (VITAMIN D-3) 125 MCG (5000 UT) TABS Take 5,000 Units by mouth daily.     esomeprazole (NEXIUM) 20 MG capsule Take 20 mg by mouth daily at 12 noon.     ibuprofen  (IBU) 800 MG tablet Take 1 tablet (800 mg total) by mouth every 8 (eight) hours as needed. 90 tablet 1   sildenafil  (REVATIO ) 20 MG tablet TAKE 3-5 TABLETS BY MOUTH ONCE DAILY 45 MINUTES PRIOR TO ACTIVITY AS NEEDED 50 tablet 0   tamsulosin  (FLOMAX ) 0.4 MG CAPS capsule Take 1 capsule (0.4 mg total) by mouth daily. 90 capsule 3   traZODone  (DESYREL ) 100 MG tablet TAKE 1 TABLET AT BEDTIME 90 tablet 3   No current facility-administered medications on file prior to visit.    BP 138/82   Pulse (!) 105   Temp 98.1 F (36.7 C) (Oral)   Ht 5' 9 (1.753 m)   Wt 245 lb (111.1 kg)   SpO2 97%   BMI 36.18 kg/m       Objective:   Physical Exam Constitutional:      Appearance: Normal appearance.  HENT:     Mouth/Throat:     Pharynx: Oropharynx is clear. Posterior oropharyngeal erythema (very mild) present. No oropharyngeal exudate, uvula swelling or postnasal drip.     Tonsils: No tonsillar exudate or tonsillar abscesses.  Cardiovascular:     Rate and Rhythm: Normal rate and regular rhythm.     Pulses: Normal pulses.     Heart sounds: Normal heart sounds.  Pulmonary:     Effort: Pulmonary effort is normal.     Breath sounds: Normal breath sounds.  Neurological:     General: No  focal deficit present.     Mental Status: He is alert and oriented to person, place, and time.  Psychiatric:  Mood and Affect: Mood normal.        Behavior: Behavior normal.        Thought Content: Thought content normal.        Judgment: Judgment normal.        Assessment & Plan:  1. 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  No signs of Epstein Barr virus. No PND. Denies GERD symptoms. He is a former smoker. Will treat with Naprosyn  and prednisone . If not resolved by the end of prednisone  therapy will refer to ENT - predniSONE  (DELTASONE ) 10 MG tablet; 40 mg x 3 days, 20 mg x 3 days, 10 mg x 3 days  Dispense: 21 tablet; Refill: 0 - naproxen  (NAPROSYN ) 500 MG tablet; Take 1 tablet (500 mg total) by mouth 2 (two) times daily with a meal.  Dispense: 30 tablet; Refill: 0  Darleene Shape, NP

## 2024-03-12 ENCOUNTER — Encounter: Payer: Self-pay | Admitting: Adult Health

## 2024-03-12 NOTE — Telephone Encounter (Signed)
 FYI

## 2024-03-30 ENCOUNTER — Other Ambulatory Visit: Payer: Self-pay | Admitting: Adult Health

## 2024-03-30 DIAGNOSIS — N5201 Erectile dysfunction due to arterial insufficiency: Secondary | ICD-10-CM

## 2024-04-05 ENCOUNTER — Other Ambulatory Visit: Payer: Self-pay | Admitting: Adult Health

## 2024-04-05 DIAGNOSIS — I10 Essential (primary) hypertension: Secondary | ICD-10-CM

## 2024-04-10 ENCOUNTER — Other Ambulatory Visit: Payer: Self-pay | Admitting: Adult Health

## 2024-04-13 ENCOUNTER — Encounter: Payer: Self-pay | Admitting: Adult Health

## 2024-04-26 ENCOUNTER — Other Ambulatory Visit: Payer: Self-pay | Admitting: Adult Health

## 2024-04-26 DIAGNOSIS — G4700 Insomnia, unspecified: Secondary | ICD-10-CM

## 2024-05-06 DIAGNOSIS — M5416 Radiculopathy, lumbar region: Secondary | ICD-10-CM | POA: Diagnosis not present

## 2024-06-09 ENCOUNTER — Other Ambulatory Visit: Payer: Self-pay | Admitting: Medical Genetics

## 2024-06-09 DIAGNOSIS — Z006 Encounter for examination for normal comparison and control in clinical research program: Secondary | ICD-10-CM

## 2024-06-14 ENCOUNTER — Other Ambulatory Visit: Payer: Self-pay | Admitting: Adult Health

## 2024-06-14 DIAGNOSIS — E7800 Pure hypercholesterolemia, unspecified: Secondary | ICD-10-CM

## 2024-07-29 DIAGNOSIS — M5416 Radiculopathy, lumbar region: Secondary | ICD-10-CM | POA: Diagnosis not present

## 2025-01-11 ENCOUNTER — Encounter: Admitting: Adult Health
